# Patient Record
Sex: Female | Born: 1975 | Race: White | Hispanic: Yes | Marital: Married | Smoking: Never smoker
Health system: Southern US, Community
[De-identification: ages and names within clinical notes are randomized; demographics above are authoritative.]

## PROBLEM LIST (undated history)

## (undated) DIAGNOSIS — R1032 Left lower quadrant pain: Secondary | ICD-10-CM

## (undated) DIAGNOSIS — T8859XA Other complications of anesthesia, initial encounter: Secondary | ICD-10-CM

## (undated) DIAGNOSIS — R011 Cardiac murmur, unspecified: Secondary | ICD-10-CM

## (undated) DIAGNOSIS — I10 Essential (primary) hypertension: Secondary | ICD-10-CM

## (undated) DIAGNOSIS — U071 COVID-19: Secondary | ICD-10-CM

## (undated) DIAGNOSIS — E785 Hyperlipidemia, unspecified: Secondary | ICD-10-CM

## (undated) HISTORY — DX: Essential (primary) hypertension: I10

## (undated) HISTORY — DX: Hyperlipidemia, unspecified: E78.5

## (undated) HISTORY — PX: ABDOMINOPLASTY: SUR9

---

## 2016-09-05 ENCOUNTER — Ambulatory Visit (INDEPENDENT_AMBULATORY_CARE_PROVIDER_SITE_OTHER): Payer: Self-pay | Admitting: Physician Assistant

## 2016-09-18 ENCOUNTER — Encounter (INDEPENDENT_AMBULATORY_CARE_PROVIDER_SITE_OTHER): Payer: Self-pay | Admitting: Physician Assistant

## 2016-09-18 ENCOUNTER — Ambulatory Visit (HOSPITAL_COMMUNITY)
Admission: RE | Admit: 2016-09-18 | Discharge: 2016-09-18 | Disposition: A | Discharge status: Home / Self Care | Payer: Medicaid Other | Source: Ambulatory Visit | Attending: Physician Assistant | Admitting: Physician Assistant

## 2016-09-18 ENCOUNTER — Other Ambulatory Visit (HOSPITAL_COMMUNITY)
Admission: RE | Admit: 2016-09-18 | Discharge: 2016-09-18 | Disposition: A | Discharge status: Home / Self Care | Payer: Medicaid Other | Source: Ambulatory Visit | Attending: Family Medicine | Admitting: Family Medicine

## 2016-09-18 ENCOUNTER — Ambulatory Visit (INDEPENDENT_AMBULATORY_CARE_PROVIDER_SITE_OTHER): Payer: Medicaid Other | Admitting: Physician Assistant

## 2016-09-18 VITALS — BP 146/96 | HR 78 | Temp 98.1°F | Ht 60.5 in | Wt 175.2 lb

## 2016-09-18 DIAGNOSIS — G8929 Other chronic pain: Secondary | ICD-10-CM

## 2016-09-18 DIAGNOSIS — M2578 Osteophyte, vertebrae: Secondary | ICD-10-CM | POA: Insufficient documentation

## 2016-09-18 DIAGNOSIS — Z01411 Encounter for gynecological examination (general) (routine) with abnormal findings: Secondary | ICD-10-CM | POA: Diagnosis not present

## 2016-09-18 DIAGNOSIS — M545 Low back pain: Secondary | ICD-10-CM

## 2016-09-18 DIAGNOSIS — Z0000 Encounter for general adult medical examination without abnormal findings: Secondary | ICD-10-CM | POA: Insufficient documentation

## 2016-09-18 DIAGNOSIS — I10 Essential (primary) hypertension: Secondary | ICD-10-CM

## 2016-09-18 DIAGNOSIS — Z124 Encounter for screening for malignant neoplasm of cervix: Secondary | ICD-10-CM | POA: Diagnosis not present

## 2016-09-18 MED ORDER — LOSARTAN POTASSIUM 25 MG PO TABS: 25.0000 mg | ORAL_TABLET | Freq: Every day | ORAL | 1 refills | Status: DC

## 2016-09-18 MED ORDER — HYDROCHLOROTHIAZIDE 12.5 MG PO TABS: 12.5000 mg | ORAL_TABLET | Freq: Every day | ORAL | 1 refills | Status: DC

## 2016-09-18 NOTE — Patient Instructions (Signed)
Plan de alimentacin DASH (DASH Eating Plan) DASH es la sigla en ingls de "Enfoques Alimentarios para Detener la Hipertensin". El plan de alimentacin DASH ha demostrado bajar la presin arterial elevada (hipertensin). Los beneficios adicionales para la salud pueden incluir la disminucin del riesgo de diabetes mellitus tipo2, enfermedades cardacas e ictus. Este plan tambin puede ayudar a adelgazar. QU DEBO SABER ACERCA DEL PLAN DE ALIMENTACIN DASH? Para el plan de alimentacin DASH, seguir las siguientes pautas generales:  Elija los alimentos que contienen menos de 150 miligramos de sodio por porcin (segn se indica en la etiqueta de los alimentos).  Use hierbas o aderezos sin sal, en lugar de sal de mesa o sal marina.  Consulte al mdico o farmacutico antes de usar sustitutos de la sal.  Consuma los productos con menor contenido de sodio. Estos productos suelen estar etiquetados como "bajo en sodio" o "sin agregado de sal".  Coma alimentos frescos. No consuma una gran cantidad de alimentos enlatados.  Coma ms verduras, frutas y productos lcteos con bajo contenido de grasas.  Elija los cereales integrales. Busque la palabra "integral" en el primer lugar de la lista de ingredientes.  Elija el pescado y el pollo o el pavo sin piel ms a menudo que las carnes rojas. Limite el consumo de pescado, carne de ave y carne a 6onzas (170g) por da.  Limite el consumo de dulces, postres, azcares y bebidas azucaradas.  Elija las grasas saludables para el corazn.  Consuma ms comida casera y menos de restaurante, de buf y comida rpida.  Limite el consumo de alimentos fritos.  No fra los alimentos. A la hora de cocinarlos, opte por hornearlos, hervirlos, grillarlos y asarlos a la parrilla.  Cuando coma en un restaurante, pida que preparen su comida con menos sal o, en lo posible, sin nada de sal. QU ALIMENTOS PUEDO COMER? Pida ayuda a un nutricionista para conocer las  necesidades calricas individuales. Cereales Pan de salvado o integral. Arroz integral. Pastas de salvado o integrales. Quinua, trigo burgol y cereales integrales. Cereales con bajo contenido de sodio. Tortillas de harina de maz o de salvado. Pan de maz integral. Galletas saladas integrales. Galletas con bajo contenido de sodio. Vegetales Verduras frescas o congeladas (crudas, al vapor, asadas o grilladas). Jugos de tomate y verduras con contenido bajo o reducido de sodio. Pasta y salsa de tomate con contenido bajo o reducido de sodio. Verduras enlatadas con bajo contenido de sodio o reducido de sodio. Frutas Frutas frescas, en conserva (en su jugo natural) o frutas congeladas. Carnes y otros productos con protenas Carne de res molida (al 85% o ms magra), carne de res de animales alimentados con pastos o carne de res sin la grasa. Pollo o pavo sin piel. Carne de pollo o de pavo molida. Cerdo sin la grasa. Todos los pescados y frutos de mar. Huevos. Porotos, guisantes o lentejas secos. Frutos secos y semillas sin sal. Frijoles enlatados sin sal. Lcteos Productos lcteos con bajo contenido de grasas, como leche descremada o al 1%, quesos reducidos en grasas o al 2%, ricota con bajo contenido de grasas o queso cottage, o yogur natural con bajo contenido de grasas. Quesos con contenido bajo o reducido de sodio. Grasas y aceites Margarinas en barra que no contengan grasas trans. Mayonesa y alios para ensaladas livianos o reducidos en grasas (reducidos en sodio). [place]. Aceites de crtamo, oliva o canola. Mantequilla natural de man o almendra. Otros Palomitas de maz y pretzels sin sal. Los artculos mencionados arriba pueden no   ser una lista completa de las bebidas o los alimentos recomendados. Comunquese con el nutricionista para conocer ms opciones. QU ALIMENTOS NO SE RECOMIENDAN? Cereales Pan blanco. Pastas blancas. Arroz blanco. Pan de maz refinado. Bagels y croissants. Galletas  saladas que contengan grasas trans. Vegetales Vegetales con crema o fritos. Verduras en salsa de queso. Verduras enlatadas comunes. Pasta y salsa de tomate en lata comunes. Jugos comunes de tomate y de verduras. Frutas Fruta enlatada en almbar liviano o espeso. Jugo de frutas. Carnes y otros productos con protenas Cortes de carne con grasa. Costillas, alas de pollo, tocineta, salchicha, mortadela, salame, chinchulines, tocino, perros calientes, salchichas alemanas y embutidos envasados. Frutos secos y semillas con sal. Frijoles con sal en lata. Lcteos Leche entera o al 2%, crema, mezcla de leche y crema, y queso crema. Yogur entero o endulzado. Quesos o queso azul con alto contenido de grasas. Cremas no lcteas y coberturas batidas. Quesos procesados, quesos para untar o cuajadas. Condimentos Sal de cebolla y ajo, sal condimentada, sal de mesa y sal marina. Salsas en lata y envasadas. Salsa Worcestershire. Salsa trtara. Salsa barbacoa. Salsa teriyaki. Salsa de soja, incluso la que tiene contenido reducido de sodio. Salsa de carne. Salsa de pescado. Salsa de ostras. Salsa rosada. Rbano picante. Ketchup y mostaza. Saborizantes y tiernizantes para carne. Caldo en cubitos. Salsa picante. Salsa tabasco. Adobos. Aderezos para tacos. Salsas. Grasas y aceites Mantequilla, margarina en barra, manteca de cerdo, grasa, mantequilla clarificada y grasa de tocino. Aceites de coco, de palmiste o de palma. Aderezos comunes para ensalada. Otros Pickles y aceitunas. Palomitas de maz y pretzels con sal. Los artculos mencionados arriba pueden no ser una lista completa de las bebidas y los alimentos que se deben evitar. Comunquese con el nutricionista para obtener ms informacin. DNDE PUEDO ENCONTRAR MS INFORMACIN? [employer] [facility]mn [employer] la [facility] (National Heart, Lung, and Blood Institute): www.nhlbi.nih.gov/health/health-topics/topics/dash/ Esta informacin no tiene como fin  reemplazar el consejo del mdico. Asegrese de hacerle al mdico cualquier pregunta que tenga. Document Released: 04/27/2011 Document Revised: 08/30/2015 Document Reviewed: 03/12/2013 Elsevier Interactive Patient Education  2017 Elsevier Inc.  

## 2016-09-18 NOTE — Progress Notes (Signed)
  Subjective:  Patient ID: Tiffany Ali, female    DOB: [birth year]  Age: 41 y.o. MRN: [id number]  CC:  Physical exam  HPI Tiffany Ali Grumbling is a 41 y.o. female with a PMH of HTN and LBP presents for an annual physical and PAP. Last PAP two years ago, no hx of abnormal PAP. No current GU sxs aside from mild and occasional dyspareunia, "feels as if something is bumped inside". In regards to HTN, takes meds as prescribed. Did not take antihypertensives today because she thought meds may interfere with lab results. Continues to have LBP. Onset of LBP remote and occurred around the time of the birth of her twins. Did not have epidural. Does not endorse radiculopathy, LE weakness, paralysis, or GI/GU sxs. Denies CP, SOB, HA, abdominal pain, f/c/n/v, or rash.  ROS Review of Systems  Constitutional: Negative for chills, fever and malaise/fatigue.  Eyes: Negative for blurred vision.  Respiratory: Negative for shortness of breath.   Cardiovascular: Negative for chest pain and palpitations.  Gastrointestinal: Negative for abdominal pain and nausea.  Genitourinary: Negative for dysuria and hematuria.  Musculoskeletal: Negative for joint pain and myalgias.  Skin: Negative for rash.  Neurological: Negative for tingling and headaches.  Psychiatric/Behavioral: Negative for depression. The patient is not nervous/anxious.     Objective:  BP (!) 146/96   Pulse 78   Temp 98.1 F (36.7 C) (Oral)   Ht 5' 0.5" (1.537 m)   Wt 175 lb 3.2 oz (79.5 kg)   SpO2 100%   BMI 33.65 kg/m   BP/Weight 5/70/1779  Systolic BP 390  Diastolic BP 96  Wt. (Lbs) 175.2  BMI 33.65      Physical Exam  Constitutional: She is oriented to person, place, and time.  Well developed, overweight, NAD, polite  HENT:  Head: Normocephalic and atraumatic.  Mouth/Throat: Oropharynx is clear and moist.  Eyes: No scleral icterus.  Neck: Normal range of motion. Neck supple. No thyromegaly present.  Cardiovascular: Normal rate,  regular rhythm and normal heart sounds.   Pulmonary/Chest: Effort normal and breath sounds normal.  Abdominal: Soft. Bowel sounds are normal. There is no tenderness.  Genitourinary:  Genitourinary Comments: Vulva normal, no lesions, vagina without discharge, cervix normal with no discharge or cervical motion tenderness. No adnexal masses or tenderness. Normal uterus.  Musculoskeletal: She exhibits no edema.  Neurological: She is alert and oriented to person, place, and time. She has normal reflexes. No cranial nerve deficit. Coordination normal.  Skin: Skin is warm and dry. No rash noted. No erythema. No pallor.  Psychiatric: She has a normal mood and affect. Her behavior is normal. Thought content normal.  Vitals reviewed.    Assessment & Plan:   1. Annual physical exam - CBC with Differential - Comprehensive metabolic panel - Lipid panel - Cytology - PAP - HIV antibody  2. Papanicolaou smear for cervical cancer screening - Cytology - PAP  3. Hypertension, unspecified type - Take Losartan and HCTZ as directed.  - Pt's blood pressure log shows optimal control of BP. Advised pt to return if BP is elevated despite use of her current treatment regimen.  No orders of the defined types were placed in this encounter.   Follow-up: Return in about 6 months (around 03/20/2017) for HTN f/u.   Clent Demark PA

## 2016-09-19 ENCOUNTER — Other Ambulatory Visit (INDEPENDENT_AMBULATORY_CARE_PROVIDER_SITE_OTHER): Payer: Self-pay | Admitting: Physician Assistant

## 2016-09-19 DIAGNOSIS — E785 Hyperlipidemia, unspecified: Secondary | ICD-10-CM

## 2016-09-19 LAB — CBC WITH DIFFERENTIAL/PLATELET
BASOS: 0 %
Basophils Absolute: 0 10*3/uL (ref 0.0–0.2)
EOS (ABSOLUTE): 0.1 10*3/uL (ref 0.0–0.4)
Eos: 1 %
Hematocrit: 37.9 % (ref 34.0–46.6)
Hemoglobin: 12.8 g/dL (ref 11.1–15.9)
IMMATURE GRANULOCYTES: 0 %
Immature Grans (Abs): 0 10*3/uL (ref 0.0–0.1)
Lymphocytes Absolute: 3.3 10*3/uL — ABNORMAL HIGH (ref 0.7–3.1)
Lymphs: 34 %
MCH: 28.7 pg (ref 26.6–33.0)
MCHC: 33.8 g/dL (ref 31.5–35.7)
MCV: 85 fL (ref 79–97)
MONOS ABS: 0.5 10*3/uL (ref 0.1–0.9)
Monocytes: 6 %
Neutrophils Absolute: 5.6 10*3/uL (ref 1.4–7.0)
Neutrophils: 59 %
PLATELETS: 329 10*3/uL (ref 150–379)
RBC: 4.46 x10E6/uL (ref 3.77–5.28)
RDW: 13.4 % (ref 12.3–15.4)
WBC: 9.6 10*3/uL (ref 3.4–10.8)

## 2016-09-19 LAB — LIPID PANEL
CHOLESTEROL TOTAL: 191 mg/dL (ref 100–199)
Chol/HDL Ratio: 4.1 ratio (ref 0.0–4.4)
HDL: 47 mg/dL (ref >39)
LDL Calculated: 123 mg/dL — ABNORMAL HIGH (ref 0–99)
TRIGLYCERIDES: 107 mg/dL (ref 0–149)
VLDL Cholesterol Cal: 21 mg/dL (ref 5–40)

## 2016-09-19 LAB — COMPREHENSIVE METABOLIC PANEL
ALT: 12 IU/L (ref 0–32)
AST: 15 IU/L (ref 0–40)
Albumin/Globulin Ratio: 1.4 (ref 1.2–2.2)
Albumin: 4.5 g/dL (ref 3.5–5.5)
Alkaline Phosphatase: 76 IU/L (ref 39–117)
BILIRUBIN TOTAL: 0.3 mg/dL (ref 0.0–1.2)
BUN/Creatinine Ratio: 26 — ABNORMAL HIGH (ref 9–23)
BUN: 16 mg/dL (ref 6–20)
CALCIUM: 9.4 mg/dL (ref 8.7–10.2)
CHLORIDE: 96 mmol/L (ref 96–106)
CO2: 28 mmol/L (ref 18–29)
Creatinine, Ser: 0.62 mg/dL (ref 0.57–1.00)
GFR calc Af Amer: 132 mL/min/{1.73_m2} (ref >59)
GFR, EST NON AFRICAN AMERICAN: 115 mL/min/{1.73_m2} (ref >59)
Globulin, Total: 3.3 g/dL (ref 1.5–4.5)
Glucose: 105 mg/dL — ABNORMAL HIGH (ref 65–99)
POTASSIUM: 4 mmol/L (ref 3.5–5.2)
Sodium: 140 mmol/L (ref 134–144)
Total Protein: 7.8 g/dL (ref 6.0–8.5)

## 2016-09-19 LAB — HIV ANTIBODY (ROUTINE TESTING W REFLEX): HIV Screen 4th Generation wRfx: NONREACTIVE

## 2016-09-19 MED ORDER — SIMVASTATIN 20 MG PO TABS: 20.0000 mg | ORAL_TABLET | Freq: Every day | ORAL | 1 refills | Status: DC

## 2016-09-19 NOTE — Progress Notes (Signed)
LDL elevated.

## 2016-09-20 LAB — CYTOLOGY - PAP: Diagnosis: NEGATIVE

## 2016-11-01 ENCOUNTER — Encounter (INDEPENDENT_AMBULATORY_CARE_PROVIDER_SITE_OTHER): Payer: Self-pay | Admitting: Physician Assistant

## 2016-11-01 ENCOUNTER — Ambulatory Visit (INDEPENDENT_AMBULATORY_CARE_PROVIDER_SITE_OTHER): Payer: Medicaid Other | Admitting: Physician Assistant

## 2016-11-01 VITALS — BP 152/89 | HR 91 | Temp 98.7°F

## 2016-11-01 DIAGNOSIS — I10 Essential (primary) hypertension: Secondary | ICD-10-CM | POA: Diagnosis not present

## 2016-11-01 MED ORDER — LOSARTAN POTASSIUM 100 MG PO TABS: 100.0000 mg | ORAL_TABLET | Freq: Every day | ORAL | 2 refills | Status: DC

## 2016-11-01 MED ORDER — AMLODIPINE BESYLATE 5 MG PO TABS: 5.0000 mg | ORAL_TABLET | Freq: Every day | ORAL | 3 refills | Status: DC

## 2016-11-01 MED ORDER — CARVEDILOL 12.5 MG PO TABS: 12.5000 mg | ORAL_TABLET | Freq: Two times a day (BID) | ORAL | 1 refills | Status: DC

## 2016-11-01 NOTE — Patient Instructions (Signed)

## 2016-11-01 NOTE — Progress Notes (Signed)
Subjective:  Patient ID: Tiffany Ali, female    DOB: [birth year]  Age: 41 y.o. MRN: [id number]  CC: BP check  HPI Tiffany Ali is a 41 y.o. female with a PMH of HTN presents to f/u on BP. Says her BP is usually in the 381W systolic and pulse is usually in the 90s despite taking her HCTZ 12.5mg  and Losartan 25mg . She also complains of HCTZ making her too fatigued and "out of it". States that she is trying to watch sodium intake. Is walking "a little" for exercise. Only associated symptom is palpitations once or twice per month when she increases activities such as when walking. Does not endorse any other symptoms.    Outpatient Medications Prior to Visit  Medication Sig Dispense Refill  . simvastatin (ZOCOR) 20 MG tablet Take 1 tablet (20 mg total) by mouth at bedtime. 90 tablet 1  . hydrochlorothiazide (HYDRODIURIL) 12.5 MG tablet Take 1 tablet (12.5 mg total) by mouth daily. 90 tablet 1  . losartan (COZAAR) 25 MG tablet Take 1 tablet (25 mg total) by mouth daily. 90 tablet 1   No facility-administered medications prior to visit.      ROS Review of Systems  Constitutional: Negative for chills, fever and malaise/fatigue.  Eyes: Negative for blurred vision.  Respiratory: Negative for shortness of breath.   Cardiovascular: Negative for chest pain and palpitations.  Gastrointestinal: Negative for abdominal pain and nausea.  Genitourinary: Negative for dysuria and hematuria.  Musculoskeletal: Negative for joint pain and myalgias.  Skin: Negative for rash.  Neurological: Negative for tingling and headaches.  Psychiatric/Behavioral: Negative for depression. The patient is not nervous/anxious.     Objective:  BP (!) 152/89   Pulse 91   Temp 98.7 F (37.1 C) (Oral)   SpO2 99%   BP/Weight 11/01/2016 2/99/3716  Systolic BP 967 893  Diastolic BP 89 96  Wt. (Lbs) - 175.2  BMI - 33.65      Physical Exam  Constitutional: She is oriented to person, place, and time.  Well  developed, overweight, NAD, polite  HENT:  Head: Normocephalic and atraumatic.  Eyes: No scleral icterus.  Neck: Normal range of motion. Neck supple. No thyromegaly present.  Cardiovascular: Normal rate, regular rhythm, normal heart sounds and intact distal pulses.   Pulmonary/Chest: Effort normal and breath sounds normal.  Musculoskeletal: She exhibits no edema.  Neurological: She is alert and oriented to person, place, and time. No cranial nerve deficit. Coordination normal.  Skin: Skin is warm and dry. No rash noted. No erythema. No pallor.  Psychiatric: She has a normal mood and affect. Her behavior is normal. Thought content normal.  Vitals reviewed.    Assessment & Plan:   1. Essential hypertension - Increase losartan (COZAAR) 100 MG tablet; Take 1 tablet (100 mg total) by mouth daily.  Dispense: 30 tablet; Refill: 2 - Begin carvedilol (COREG) 12.5 MG tablet; Take 1 tablet (12.5 mg total) by mouth 2 (two) times daily with a meal.  Dispense: 90 tablet; Refill: 1   Meds ordered this encounter  Medications  . losartan (COZAAR) 100 MG tablet    Sig: Take 1 tablet (100 mg total) by mouth daily.    Dispense:  30 tablet    Refill:  2    Order Specific Question:   Supervising Provider    Answer:   Tresa Garter [id number]  . DISCONTD: amLODipine (NORVASC) 5 MG tablet    Sig: Take 1 tablet (5 mg total) by mouth daily.  Dispense:  90 tablet    Refill:  3    Order Specific Question:   Supervising Provider    Answer:   Tresa Garter [id number]  . carvedilol (COREG) 12.5 MG tablet    Sig: Take 1 tablet (12.5 mg total) by mouth 2 (two) times daily with a meal.    Dispense:  90 tablet    Refill:  1    Order Specific Question:   Supervising Provider    Answer:   Tresa Garter [id number]    Follow-up: Return in about 4 weeks (around 11/29/2016) for Hypertension.   Clent Demark PA

## 2016-11-07 ENCOUNTER — Encounter (INDEPENDENT_AMBULATORY_CARE_PROVIDER_SITE_OTHER): Payer: Medicaid Other

## 2016-12-01 ENCOUNTER — Ambulatory Visit (INDEPENDENT_AMBULATORY_CARE_PROVIDER_SITE_OTHER): Payer: Medicaid Other | Admitting: Physician Assistant

## 2016-12-01 ENCOUNTER — Encounter (INDEPENDENT_AMBULATORY_CARE_PROVIDER_SITE_OTHER): Payer: Self-pay | Admitting: Physician Assistant

## 2016-12-01 VITALS — BP 171/102 | HR 104 | Temp 98.2°F | Wt 174.0 lb

## 2016-12-01 DIAGNOSIS — R011 Cardiac murmur, unspecified: Secondary | ICD-10-CM

## 2016-12-01 DIAGNOSIS — I10 Essential (primary) hypertension: Secondary | ICD-10-CM

## 2016-12-01 DIAGNOSIS — E785 Hyperlipidemia, unspecified: Secondary | ICD-10-CM | POA: Diagnosis not present

## 2016-12-01 MED ORDER — SIMVASTATIN 20 MG PO TABS: 20.0000 mg | ORAL_TABLET | Freq: Every day | ORAL | 1 refills | Status: DC

## 2016-12-01 MED ORDER — LOSARTAN POTASSIUM 100 MG PO TABS: 100.0000 mg | ORAL_TABLET | Freq: Every day | ORAL | 1 refills | Status: DC

## 2016-12-01 NOTE — Progress Notes (Signed)
Subjective:  Patient ID: Tiffany Ali, female    DOB: 1975-08-24  Age: 41 y.o. MRN: [id number]  CC: f/u HTN  HPI [name] Grumbling is a 41 y.o. female with a PMH of HTN presents to f/u on BP. Office BP readings have been 146/96 and 152/89. Today's BP is 171/102 repeat 159/93. Says BP is usually higher when seeing a provider. Reports BP is usually 659D systolic. Patient states she is taking Losartan 100 mg. Took carvedilol for only two days because she fell "out of it".  States that she is trying to watch sodium intake. Is walking "a little" for exercise. Only associated symptom is palpitations once or twice per month when she increases activities such as when walking. Does not endorse any other symptoms.     ROS Review of Systems  Constitutional: Negative for chills, fever and malaise/fatigue.  Eyes: Negative for blurred vision.  Respiratory: Negative for shortness of breath.   Cardiovascular: Positive for palpitations. Negative for chest pain.  Gastrointestinal: Negative for abdominal pain and nausea.  Genitourinary: Negative for dysuria and hematuria.  Musculoskeletal: Negative for joint pain and myalgias.  Skin: Negative for rash.  Neurological: Negative for tingling and headaches.  Psychiatric/Behavioral: Negative for depression. The patient is not nervous/anxious.     Objective:  BP (!) 171/102 (BP Location: Left Arm, Patient Position: Sitting, Cuff Size: Large)   Pulse (!) 104   Temp 98.2 F (36.8 C) (Oral)   Wt 174 lb (78.9 kg)   LMP 11/22/2016 (Exact Date)   SpO2 100%   BMI 33.42 kg/m   BP/Weight 12/01/2016 11/01/2016 3/57/0177  Systolic BP 939 030 092  Diastolic BP 330 89 96  Wt. (Lbs) 174 - 175.2  BMI 33.42 - 33.65      Physical Exam  Constitutional: She is oriented to person, place, and time.  Well developed, overweight, NAD, polite  HENT:  Head: Normocephalic and atraumatic.  Eyes: No scleral icterus.  Neck: No thyromegaly present.  Cardiovascular: Normal  rate, regular rhythm and normal heart sounds.   2/6 pansystolic murmur. S1 and S2. No rubs. No carotid bruits.  Pulmonary/Chest: Effort normal and breath sounds normal.  Musculoskeletal: She exhibits no edema.  Neurological: She is alert and oriented to person, place, and time. No cranial nerve deficit. Coordination normal.  Skin: Skin is warm and dry. No rash noted. No erythema. No pallor.  Psychiatric: She has a normal mood and affect. Her behavior is normal. Thought content normal.  Vitals reviewed.    Assessment & Plan:   1. Hypertension, unspecified type - Ambulatory referral to Cardiology - Refill losartan (COZAAR) 100 MG tablet; Take 1 tablet (100 mg total) by mouth daily.  Dispense: 90 tablet; Refill: 1  2. Hyperlipidemia, unspecified hyperlipidemia type - Refill simvastatin (ZOCOR) 20 MG tablet; Take 1 tablet (20 mg total) by mouth at bedtime.  Dispense: 90 tablet; Refill: 1  3. Heart murmur - Ambulatory referral to Cardiology   Meds ordered this encounter  Medications  . simvastatin (ZOCOR) 20 MG tablet    Sig: Take 1 tablet (20 mg total) by mouth at bedtime.    Dispense:  90 tablet    Refill:  1    Order Specific Question:   Supervising Provider    Answer:   Tresa Garter [id number]  . losartan (COZAAR) 100 MG tablet    Sig: Take 1 tablet (100 mg total) by mouth daily.    Dispense:  90 tablet    Refill:  1  Order Specific Question:   Supervising Provider    Answer:   Tresa Garter [[id number]]    Follow-up: Return in about 4 months (around 04/03/2017).   Clent Demark PA

## 2016-12-01 NOTE — Patient Instructions (Addendum)
Hipertensin Hypertension La hipertensin, conocida comnmente como presin arterial alta, se produce cuando la sangre bombea en las arterias con mucha fuerza. Las arterias son los vasos sanguneos que transportan la sangre desde el corazn al resto del cuerpo. La hipertensin hace que el corazn haga ms esfuerzo para Chiropodist y [employer] Corporation que las arterias se Teacher, music o Advertising account executive. La hipertensin no tratada o no controlada puede causar infarto de miocardio, accidentes cerebrovasculares, enfermedad renal y otros problemas. Una lectura de la presin arterial consiste de un nmero ms alto sobre un nmero ms bajo. En condiciones ideales, la presin arterial debe estar por debajo de 120/80. El primer nmero ("superior") es la presin sistlica. Es la medida de la presin de las arterias cuando el corazn late. El segundo nmero ("inferior") es la presin diastlica. Es la medida de la presin en las arterias cuando el corazn se relaja. Cules son las causas? Se desconoce la causa de esta afeccin. Qu incrementa el riesgo? Algunos factores de riesgo de hipertensin estn bajo su control. Otros no. Factores que puede [employer].  Tener diabetes mellitus tipo 2, colesterol alto, o ambos.  No hacer la cantidad suficiente de actividad fsica o ejercicio.  Tener sobrepeso.  Consumir mucha grasa, azcar, caloras o sal (sodio) en su dieta.  Beber alcohol en exceso. Factores que son difciles o imposibles de modificar  Tener enfermedad renal crnica.  Tener antecedentes familiares de presin arterial alta.  La edad. Los riesgos aumentan con la edad.  La raza. El riesgo es mayor para las Retail banker.  El sexo. Antes de los 45aos, los hombres corren ms [employer]. Despus de los 65aos, las mujeres corren ms [employer].  Tener apnea obstructiva del sueo.  El estrs. Cules son los signos o los sntomas? La presin arterial  extremadamente alta (crisis hipertensiva) puede provocar:  Dolor de [place].  Ansiedad.  Falta de aire.  Hemorragia nasal.  Nuseas y vmitos.  Dolor de pecho intenso.  Una crisis de movimientos que no puede controlar (convulsiones).  Cmo se diagnostica? Esta afeccin se diagnostica midiendo su presin arterial mientras se encuentra sentado, con el brazo apoyado sobre una superficie. El brazalete del tensimetro debe colocarse directamente sobre la piel de la parte superior del brazo y al nivel de su corazn. Debe medirla al [facility] veces en el mismo brazo. Determinadas condiciones pueden causar una diferencia de presin arterial entre el brazo izquierdo y Insurance underwriter. Ciertos factores pueden provocar que las lecturas de la presin arterial sean inferiores o superiores a lo normal (elevadas) por un perodo corto de tiempo:  Si su presin arterial es ms alta cuando se encuentra en el consultorio del mdico que cuando la mide en su hogar, se denomina "hipertensin de bata blanca". La [employer] de las personas que tienen esta afeccin no deben ser [employer].  Si su presin arterial es ms alta en el hogar que cuando se encuentra en el consultorio del mdico, se denomina "hipertensin enmascarada". La [employer] de las personas que tienen esta afeccin deben ser medicadas para Chief Technology Officer la presin arterial.  Si tiene una lecturas de presin arterial alta durante una visita o si tiene presin arterial normal con otros factores de riesgo:  Es posible que se le pida que regrese Administrator, arts para volver a Chief Technology Officer su presin arterial.  Se le puede pedir que se controle la presin arterial en su casa durante 1 semana o ms.  Si se le diagnostica hipertensin, es posible que  se le realicen otros anlisis de sangre o estudios de diagnstico por imgenes para ayudar a su mdico a comprender su riesgo general de tener otras afecciones. Cmo se trata? Esta afeccin se trata haciendo cambios saludables  en el estilo de vida, tales como ingerir alimentos saludables, realizar ms ejercicio y reducir el consumo de alcohol. El mdico puede recetarle medicamentos si los cambios en el estilo de vida no son suficientes para Child psychotherapist la presin arterial y si:  Su presin arterial sistlica est por encima de 130.  Su presin arterial diastlica est por encima de 80.  La presin arterial deseada puede variar en funcin de las enfermedades, la edad y otros factores personales. Siga estas instrucciones en su casa: Comida y bebida  Siga una dieta con alto contenido de fibras y [place], y con bajo contenido de sodio, Location manager agregada y Physicist, medical. Un ejemplo de plan alimenticio es la dieta DASH (Dietary Approaches to Stop Hypertension, Mtodos alimenticios para detener la hipertensin). Para alimentarse de esta manera: ? Coma mucha fruta y [place]. Trate de que la mitad del plato de cada comida sea de frutas y verduras. ? Coma cereales integrales, como pasta integral, arroz integral y pan integral. Llene aproximadamente un cuarto del plato con cereales integrales. ? Coma y beba productos lcteos con bajo contenido de grasa, como leche descremada o yogur bajo en grasas. ? Evite la ingesta de cortes de carne grasa, carne procesada o curada, y carne de ave con piel. Llene aproximadamente un cuarto del plato con protenas magras, como pescado, pollo sin piel, frijoles, huevos y tofu. ? Evite ingerir alimentos prehechos o procesados. En general, estos tienen mayor cantidad de sodio, azcar agregada y Wendee Copp.  Reduzca su ingesta diaria de sodio. La mayora de las personas que tienen hipertensin deben comer menos de 1500 mg de sodio por [employer].  Limite el consumo de alcohol a no ms de 1 medida por da si es mujer y no est Music therapist y a 2 medidas por da si es hombre. Una medida equivale a 12onzas de cerveza, 5onzas de vino o 1onzas de bebidas alcohlicas de alta graduacin. Estilo de vida  Trabaje  con su mdico para mantener un peso saludable o Administrator, Civil Service. Pregntele cual es su peso recomendado.  Realice al menos 30 minutos de ejercicio que haga que se acelere su corazn (ejercicio Arboriculturist) la [employer] de la [place]. Estas actividades pueden incluir caminar, nadar o andar en bicicleta.  Incluya ejercicios para fortalecer sus msculos (ejercicios de resistencia), como pilates o levantamiento de pesas, como parte de su rutina semanal de ejercicios. Intente realizar 14mnutos de este tipo de ejercicios al mSolectron Corporationa la [place]  No consuma ningn producto que contenga nicotina o tabaco, como cigarrillos y cPsychologist, sport and exercise Si necesita ayuda para dejar de fumar, consulte al mdico.  Contrlese la presin arterial en su casa segn las indicaciones del mdico.  Concurra a todas las visitas de control como se lo haya indicado el mdico. Esto es importante. Medicamentos  TDelphide venta libre y los recetados solamente como se lo haya indicado el mdico. Siga cuidadosamente las indicaciones. Los medicamentos para la presin arterial deben tomarse segn las indicaciones.  No omita las dosis de medicamentos para la presin arterial. Si lo hace, estar en riesgo de tener problemas y puede hacer que los medicamentos sean menos eficaces.  Pregntele a su mdico a qu efectos secundarios o reacciones a los mCareers information officer Comunquese con  un mdico si:  Piensa que tiene una reaccin a un medicamento que est tomando.  Tiene dolores de cabeza frecuentes (recurrentes).  Siente mareos.  Tiene hinchazn en los tobillos.  Tiene problemas de visin. Solicite ayuda de inmediato si:  Siente un dolor de cabeza intenso o confusin.  Siente debilidad inusual o adormecimiento.  Siente que va a desmayarse.  Siente un dolor intenso en el pecho o el abdomen.  Vomita repetidas veces.  Tiene dificultad para respirar. Resumen  La  hipertensin se produce cuando la sangre bombea en las arterias con mucha fuerza. Si esta afeccin no se controla, podra correr riesgo de tener complicaciones graves.  La presin arterial deseada puede variar en funcin de las enfermedades, la edad y otros factores personales. Para la [employer], una presin arterial normal es menor que 120/80.  La hipertensin se trata con cambios en el estilo de vida, medicamentos o una combinacin de [place]. Los McDonald's Corporation estilo de vida incluyen prdida de peso, ingerir alimentos sanos, seguir una dieta baja en sodio, hacer ms ejercicio y Environmental consultant consumo de alcohol. Esta informacin no tiene Marine scientist el consejo del mdico. Asegrese de hacerle al mdico cualquier pregunta que tenga. Document Released: 05/08/2005 Document Revised: 04/19/2016 Document Reviewed: 04/19/2016 Elsevier Interactive Patient Education  2018 [place] (Heart Murmur) Un soplo cardaco es un sonido adicional que su mdico oye cuando escucha su corazn con un dispositivo denominado estetoscopio. El sonido proviene de una turbulencia provocada cuando la sangre circula a travs del corazn y el sonido puede ser como un zumbido o un silbido cuando el corazn late. Existen dos tipos de soplos cardacos:  Soplo cardaco inocente. La mayora de las personas con este tipo de soplo no tienen un problema cardaco. Muchos nios tienen soplos cardacos inocentes. Su mdico puede sugerir Tesoro Corporation bsicos para saber si el soplo es un soplo inocente. Si se descubre un soplo cardaco inocente, no hay necesidad de PPL Corporation, Building services engineer, restringir las actividades ni suspender la prctica de deportes.  Soplo cardaco anormal. Este tipo de soplo cardaco puede aparecer en nios y adultos. En los nios, por lo general los soplos cardacos anormales son causados por defectos cardacos que estn presentes desde el nacimiento  (congnitos). En los adultos, los soplos anormales normalmente provienen de problemas en las vlvulas cardacas causados por una enfermedad, infeccin o el envejecimiento. CAUSAS Normalmente, estas vlvulas se abren para dejar que la sangre circule a travs o fuera del corazn y Air traffic controller se cierran para evitar que la sangre regrese. Si las vlvulas no funcionan correctamente, puede experimentar los siguientes sntomas:  Regurgitacin: cuando la sangre se filtra a travs de la vlvula en la direccin incorrecta.  Prolapso de la vlvula mitral: cuando la vlvula mitral del corazn tiene una aleta suelta y no se cierra hermticamente.  Estenosis: cuando la vlvula no se abre lo suficiente y bloquea el flujo sanguneo. [place] soplos inocentes no provocan sntomas, y muchas personas con soplos anormales pueden tener sntomas o no. Si hay sntomas, pueden ser:  Hollace Kinnier de aire.  Color azul en la piel, especialmente en las puntas de los dedos.  Dolor en el pecho.  Palpitaciones, sentir un aleteo o latido cardaco irregular.  Desmayos.  Tos persistente.  Cansarse con mucha ms rapidez de lo esperado. DIAGNSTICO Un soplo cardaco se puede escuchar durante un examen mdico deportivo o durante otro tipo de examen. Cuando se escucha un soplo, esto puede  sugerir un posible problema. Cuando esto ocurre, es posible que el mdico le recomiende ver a Forensic scientist (cardilogo). Tambin le pueden pedir que se realice uno o ms estudios cardacos. En [employer], los estudios pueden variar segn lo que escuch su mdico. Estos son algunos de los estudios indicados para un soplo cardaco:  Aeronautical engineer.  Ecocardiograma.  Resonancia magntica. Para nios y adultos que tienen un soplo cardaco anormal y desean Careers information officer, es importante completar los estudios, Physiological scientist los [place] con su mdico y seguir sus Scientist, research (life sciences). Si hay una enfermedad cardaca, es posible  que no sea seguro practicar un deporte. TRATAMIENTO Los soplos inocentes no requieren tratamiento ni restriccin de actividades. Si un soplo anormal representa un problema en el corazn, el tratamiento depender de la naturaleza exacta del problema. En [employer], es posible que sean necesarios medicamentos o ciruga para tratar el problema. INSTRUCCIONES PARA EL CUIDADO EN EL HOGAR Si desea practicar un deporte u otros tipos de [place] fsica intensa, es importante analizar esta situacin primero con su mdico. Si el soplo representa un problema en el corazn y decide practicar un deporte, existe una pequea probabilidad de que suceda un problema grave (incluida muerte sbita). SOLICITE ATENCIN MDICA SI:  Siente que sus sntomas empeoran lentamente.  Desarrolla sntomas nuevos que le preocupan.  Siente que los medicamentos recetados le provocan efectos secundarios.  SOLICITE ATENCIN MDICA DE INMEDIATO SI:  Siente dolor en el pecho.  Le falta el aire.  Observa que su corazn late de forma irregular, con una frecuencia preocupante  Sufre episodios de [employer].  Los sntomas empeoran repentinamente.  Esta informacin no tiene Marine scientist el consejo del mdico. Asegrese de hacerle al mdico cualquier pregunta que tenga. Document Released: 05/08/2005 Document Revised: 05/29/2014 Document Reviewed: 11/11/2015 Elsevier Interactive Patient Education  2017 [employer].

## 2017-01-09 NOTE — Progress Notes (Signed)
Cardiology Office Note   Date:  01/10/2017   ID:  Tiffany, Ali 22-Sep-1975, MRN [id number]  PCP:  Clent Demark, PA-C  Cardiologist:   Peter Martinique, MD   Chief Complaint  Patient presents with  . Follow-up    NP. BP going up and down.  Marland Kitchen Heart Murmur      History of Present Illness: Tiffany Ali is a 41 y.o. female who is seen at the request of Dr. Altamease Oiler for evaluation of a heart murmur. She has a history of HTN and HLD. Her husband is a Theme park manager from [place] living in the [place] for one year. She reports severe HTN with pregnancy. Still will have elevation of BP when under stress. Seen for physical and noted to have a heart murmur. Does not know of prior history of a heart murmur. Denies chest pain, SOB, dizziness. Does note fleeing symptoms of heart racing about once a week lasting seconds at a time. No history of rheumatic fever.  Past Medical History:  Diagnosis Date  . Hyperlipidemia   . Hypertension     Past Surgical History:  Procedure Laterality Date  . CESAREAN SECTION       Current Outpatient Prescriptions  Medication Sig Dispense Refill  . carvedilol (COREG) 12.5 MG tablet Take 1 tablet (12.5 mg total) by mouth 2 (two) times daily with a meal. 90 tablet 1  . losartan (COZAAR) 100 MG tablet Take 1 tablet (100 mg total) by mouth daily. 90 tablet 1  . simvastatin (ZOCOR) 20 MG tablet Take 1 tablet (20 mg total) by mouth at bedtime. 90 tablet 1   No current facility-administered medications for this visit.     Allergies:   Patient has no known allergies.    Social History:  The patient  reports that she has never smoked. She has never used smokeless tobacco.   Family History:  The patient's family history includes Hypertension in her mother.    ROS:  Please see the history of present illness.   Otherwise, review of systems are positive for none.   All other systems are reviewed and negative.    PHYSICAL EXAM: VS:  BP 126/80   Pulse 95   Ht 5' 0.5"  (1.537 m)   Wt 173 lb (78.5 kg)   BMI 33.23 kg/m  , BMI Body mass index is 33.23 kg/m. GEN: Well nourished, well developed, in no acute distress  HEENT: normal  Neck: no JVD, carotid bruits, or masses Cardiac: RRR; Normal S1-2. There is a 0-8/6 systolic murmur RUSB. No diastolic murmur.  Respiratory:  clear to auscultation bilaterally, normal work of breathing GI: soft, nontender, nondistended, + BS MS: no deformity or atrophy  Skin: warm and dry, no rash Neuro:  Strength and sensation are intact Psych: euthymic mood, full affect   EKG:  EKG is ordered today. The ekg ordered today demonstrates NSR with normal Ecg.    Recent Labs: 09/18/2016: ALT 12; BUN 16; Creatinine, Ser 0.62; Hemoglobin 12.8; Platelets 329; Potassium 4.0; Sodium 140    Lipid Panel    Component Value Date/Time   CHOL 191 09/18/2016 0920   TRIG 107 09/18/2016 0920   HDL 47 09/18/2016 0920   CHOLHDL 4.1 09/18/2016 0920   LDLCALC 123 (H) 09/18/2016 0920      Wt Readings from Last 3 Encounters:  01/10/17 173 lb (78.5 kg)  12/01/16 174 lb (78.9 kg)  09/18/16 175 lb 3.2 oz (79.5 kg)      Other  studies Reviewed: Additional studies/ records that were reviewed today include: none   ASSESSMENT AND PLAN:  1.  Heart murmur. I suspect this is an innocent heart murmur or possibly related to HTN. She is asymptomatic except for some mild palpitations. Ecg is normal. I have recommended an Echocardiogram to further evaluate. If normal I would just reassure.    Current medicines are reviewed at length with the patient today.  The patient does not have concerns regarding medicines.  The following changes have been made:  no change  Labs/ tests ordered today include: Echocardiogram   Disposition:   FU TBD  Signed, Peter Martinique, MD  01/10/2017 2:19 PM    [facility] [address], [place], [place], [address] Phone [phone], Fax [phone]

## 2017-01-10 ENCOUNTER — Ambulatory Visit (INDEPENDENT_AMBULATORY_CARE_PROVIDER_SITE_OTHER): Payer: Medicaid Other | Admitting: Cardiology

## 2017-01-10 ENCOUNTER — Encounter: Payer: Self-pay | Admitting: Cardiology

## 2017-01-10 VITALS — BP 126/80 | HR 95 | Ht 60.5 in | Wt 173.0 lb

## 2017-01-10 DIAGNOSIS — I10 Essential (primary) hypertension: Secondary | ICD-10-CM

## 2017-01-10 DIAGNOSIS — R011 Cardiac murmur, unspecified: Secondary | ICD-10-CM

## 2017-01-16 ENCOUNTER — Other Ambulatory Visit: Payer: Self-pay

## 2017-01-16 ENCOUNTER — Ambulatory Visit (HOSPITAL_COMMUNITY): Payer: Medicaid Other | Attending: Cardiology

## 2017-01-16 DIAGNOSIS — I061 Rheumatic aortic insufficiency: Secondary | ICD-10-CM | POA: Insufficient documentation

## 2017-01-16 DIAGNOSIS — I5030 Unspecified diastolic (congestive) heart failure: Secondary | ICD-10-CM | POA: Diagnosis not present

## 2017-01-16 DIAGNOSIS — R011 Cardiac murmur, unspecified: Secondary | ICD-10-CM

## 2017-01-23 ENCOUNTER — Telehealth (INDEPENDENT_AMBULATORY_CARE_PROVIDER_SITE_OTHER): Payer: Self-pay | Admitting: Physician Assistant

## 2017-01-23 NOTE — Telephone Encounter (Signed)
Spanish patient wants to know her results of her EKG and Echo results  Thank You

## 2017-01-26 NOTE — Telephone Encounter (Signed)
MA used pacific interpreter to contact the patient. Interpreter name: Annalee Genta ID: [id number] Patient verified DOB Patient is aware of EKG and ECHO being normal.

## 2017-05-07 ENCOUNTER — Encounter (INDEPENDENT_AMBULATORY_CARE_PROVIDER_SITE_OTHER): Payer: Self-pay | Admitting: Physician Assistant

## 2017-05-25 ENCOUNTER — Encounter (HOSPITAL_COMMUNITY): Payer: Self-pay | Admitting: Emergency Medicine

## 2017-05-25 ENCOUNTER — Other Ambulatory Visit: Payer: Self-pay

## 2017-05-25 ENCOUNTER — Emergency Department (HOSPITAL_COMMUNITY): Payer: Medicaid Other

## 2017-05-25 DIAGNOSIS — Y9367 Activity, basketball: Secondary | ICD-10-CM | POA: Insufficient documentation

## 2017-05-25 DIAGNOSIS — Y929 Unspecified place or not applicable: Secondary | ICD-10-CM | POA: Diagnosis not present

## 2017-05-25 DIAGNOSIS — Y999 Unspecified external cause status: Secondary | ICD-10-CM | POA: Diagnosis not present

## 2017-05-25 DIAGNOSIS — X58XXXA Exposure to other specified factors, initial encounter: Secondary | ICD-10-CM | POA: Insufficient documentation

## 2017-05-25 DIAGNOSIS — Z79899 Other long term (current) drug therapy: Secondary | ICD-10-CM | POA: Diagnosis not present

## 2017-05-25 DIAGNOSIS — I10 Essential (primary) hypertension: Secondary | ICD-10-CM | POA: Insufficient documentation

## 2017-05-25 DIAGNOSIS — S6991XA Unspecified injury of right wrist, hand and finger(s), initial encounter: Secondary | ICD-10-CM | POA: Diagnosis not present

## 2017-05-25 NOTE — ED Triage Notes (Signed)
Pt jammed her pinkie on her right hand while playing basketball.  Area appears swollen.

## 2017-05-26 ENCOUNTER — Emergency Department (HOSPITAL_COMMUNITY)
Admission: EM | Admit: 2017-05-26 | Discharge: 2017-05-26 | Disposition: A | Discharge status: Home / Self Care | Payer: Medicaid Other | Attending: Emergency Medicine | Admitting: Emergency Medicine

## 2017-05-26 DIAGNOSIS — S6991XA Unspecified injury of right wrist, hand and finger(s), initial encounter: Secondary | ICD-10-CM

## 2017-05-26 MED ORDER — NAPROXEN 250 MG PO TABS
500.0000 mg | ORAL_TABLET | Freq: Once | ORAL | Status: AC
Start: 2017-05-26 — End: 2017-05-26
  Administered 2017-05-26: 500 mg via ORAL
  Filled 2017-05-26: qty 2

## 2017-05-26 MED ORDER — NAPROXEN 500 MG PO TABS: 500.0000 mg | ORAL_TABLET | Freq: Two times a day (BID) | ORAL | 0 refills | Status: DC

## 2017-05-26 NOTE — ED Provider Notes (Signed)
[facility] EMERGENCY DEPARTMENT Provider Note   CSN: [id number] Arrival date & time: 05/25/17  2145     History   Chief Complaint No chief complaint on file.   HPI Tiffany Ali is a 42 y.o. female with a past medical history of hypertension, who presents to ED for evaluation of right pinky finger pain and swelling after injury that occurred prior to arrival.  She was playing basketball when she jammed her finger.  Her family member straightened out her finger but she is continuing to have pain and swelling.  No previous fracture, dislocations or procedures in the area.  She did not take any medications prior to arrival to help with symptoms.  She denies any numbness in arm, symptoms prior to injury, fever, history of infected joint.  HPI  Past Medical History:  Diagnosis Date  . Hyperlipidemia   . Hypertension     Patient Active Problem List   Diagnosis Date Noted  . Annual physical exam 09/18/2016    Past Surgical History:  Procedure Laterality Date  . CESAREAN SECTION      OB History    No data available       Home Medications    Prior to Admission medications   Medication Sig Start Date End Date Taking? Authorizing Provider  carvedilol (COREG) 12.5 MG tablet Take 1 tablet (12.5 mg total) by mouth 2 (two) times daily with a meal. 11/01/16   Clent Demark, PA-C  losartan (COZAAR) 100 MG tablet Take 1 tablet (100 mg total) by mouth daily. 12/01/16   Clent Demark, PA-C  naproxen (NAPROSYN) 500 MG tablet Take 1 tablet (500 mg total) by mouth 2 (two) times daily. 05/26/17   Vermell Madrid, PA-C  simvastatin (ZOCOR) 20 MG tablet Take 1 tablet (20 mg total) by mouth at bedtime. 12/01/16   Clent Demark, PA-C    Family History Family History  Problem Relation Age of Onset  . Hypertension Mother     Social History Social History   Tobacco Use  . Smoking status: Never Smoker  . Smokeless tobacco: Never Used  Substance Use Topics  .  Alcohol use: Not on file  . Drug use: Not on file     Allergies   Patient has no known allergies.   Review of Systems Review of Systems  Constitutional: Negative for chills and fever.  Musculoskeletal: Positive for arthralgias and joint swelling. Negative for back pain, gait problem, neck pain and neck stiffness.  Skin: Negative for color change and wound.  Neurological: Negative for weakness and numbness.     Physical Exam Updated Vital Signs BP (!) 166/87 (BP Location: Right Arm)   Pulse 75   Temp 98.8 F (37.1 C) (Oral)   Resp 18   LMP 05/21/2017   SpO2 100%   Physical Exam  Constitutional: She appears well-developed and well-nourished. No distress.  Nontoxic-appearing and in no acute distress.  Pleasant.  HENT:  Head: Normocephalic and atraumatic.  Eyes: Conjunctivae and EOM are normal. No scleral icterus.  Neck: Normal range of motion.  Pulmonary/Chest: Effort normal. No respiratory distress.  Musculoskeletal: Normal range of motion. She exhibits tenderness. She exhibits no edema or deformity.  Tenderness to palpation of the PIP and MCP joints of the right fifth digit.  No edema or open wound noted.  Able to flex and extend the digit although pain with flexion.  No palmar tenderness or discoloration noted.  Sensation intact to light touch of bilateral upper  extremities.  2+ radial pulse noted bilaterally.  Neurological: She is alert.  Skin: No rash noted. She is not diaphoretic.  Psychiatric: She has a normal mood and affect.  Nursing note and vitals reviewed.    ED Treatments / Results  Labs (all labs ordered are listed, but only abnormal results are displayed) Labs Reviewed - No data to display  EKG  EKG Interpretation None       Radiology Dg Finger Little Right  Result Date: 05/25/2017 CLINICAL DATA:  Right fifth finger pain after basketball injury. EXAM: RIGHT LITTLE FINGER 2+V COMPARISON:  None. FINDINGS: There is no evidence of fracture or  dislocation. There is no evidence of arthropathy or other focal bone abnormality. Soft tissues are unremarkable. IMPRESSION: No acute osseous abnormality of the right fifth finger. Electronically Signed   By: Ashley Royalty M.D.   On: 05/25/2017 22:35    Procedures Procedures (including critical care time)  Medications Ordered in ED Medications  naproxen (NAPROSYN) tablet 500 mg (not administered)     Initial Impression / Assessment and Plan / ED Course  I have reviewed the triage vital signs and the nursing notes.  Pertinent labs & imaging results that were available during my care of the patient were reviewed by me and considered in my medical decision making (see chart for details).     Patient presents to ED for evaluation of right fifth digit pain after jamming it while playing basketball prior to arrival.  She does have tenderness to palpation of the PIP and MCP joints but able to perform range of motion.  No open wounds, color or temperature change of joints that would make me suspicious for a septic joint or vascular cause.  No previous fracture, dislocation or procedure in the area.  She is overall well-appearing.  X-rays were negative for acute osseous abnormality.  I suspect contusion being the cause of her symptoms.  Will give anti-inflammatories, follow-up with hand specialist and finger splint for support.  Patient appears stable for discharge at this time.  Strict return precautions given.  Final Clinical Impressions(s) / ED Diagnoses   Final diagnoses:  Injury of finger of right hand, initial encounter    ED Discharge Orders        Ordered    naproxen (NAPROSYN) 500 MG tablet  2 times daily     05/26/17 0047     Portions of this note were generated with Dragon dictation software. Dictation errors may occur despite best attempts at proofreading.    Delia Heady, PA-C 05/26/17 0867    Orpah Greek, MD 05/26/17 [phone]

## 2017-05-26 NOTE — Discharge Instructions (Signed)
Take naproxen as needed for pain and swelling. Wear finger splint as directed. Follow-up with hand specialist listed below for further evaluation if your symptoms persist for the next 2-3 weeks. Return to ED for worsening symptoms, redness or tender joint, additional injuries, numbness in hand.   Tome naproxeno segn sea necesario para el dolor y la hinchazn. Use la frula para los dedos como se indica. Haga un seguimiento con Human resources officer en manos que se indica a continuacin para una evaluacin adicional si sus sntomas persisten durante las prximas 2 a 3 semanas. Regrese a la ED para [place] sntomas, enrojecimiento o Radiation protection practitioner las articulaciones, lesiones adicionales, adormecimiento en la mano.

## 2017-06-21 ENCOUNTER — Encounter (INDEPENDENT_AMBULATORY_CARE_PROVIDER_SITE_OTHER): Payer: Self-pay | Admitting: Physician Assistant

## 2017-06-22 ENCOUNTER — Encounter (INDEPENDENT_AMBULATORY_CARE_PROVIDER_SITE_OTHER): Payer: Self-pay | Admitting: Physician Assistant

## 2017-07-19 ENCOUNTER — Encounter (INDEPENDENT_AMBULATORY_CARE_PROVIDER_SITE_OTHER): Payer: Self-pay | Admitting: Physician Assistant

## 2017-07-19 ENCOUNTER — Ambulatory Visit (INDEPENDENT_AMBULATORY_CARE_PROVIDER_SITE_OTHER): Payer: Self-pay | Admitting: Physician Assistant

## 2017-07-19 VITALS — BP 142/88 | HR 105 | Temp 99.5°F | Resp 18 | Ht 60.0 in | Wt 173.0 lb

## 2017-07-19 DIAGNOSIS — E785 Hyperlipidemia, unspecified: Secondary | ICD-10-CM

## 2017-07-19 DIAGNOSIS — S6991XA Unspecified injury of right wrist, hand and finger(s), initial encounter: Secondary | ICD-10-CM

## 2017-07-19 DIAGNOSIS — I10 Essential (primary) hypertension: Secondary | ICD-10-CM

## 2017-07-19 MED ORDER — CARVEDILOL 12.5 MG PO TABS: 12.5000 mg | ORAL_TABLET | Freq: Two times a day (BID) | ORAL | 1 refills | Status: DC

## 2017-07-19 MED ORDER — NAPROXEN 500 MG PO TABS: 500.0000 mg | ORAL_TABLET | Freq: Two times a day (BID) | ORAL | 0 refills | Status: DC

## 2017-07-19 MED ORDER — ACETAMINOPHEN 500 MG PO TABS: 1000.0000 mg | ORAL_TABLET | Freq: Three times a day (TID) | ORAL | 0 refills | Status: DC | PRN

## 2017-07-19 MED ORDER — LOSARTAN POTASSIUM 100 MG PO TABS: 100.0000 mg | ORAL_TABLET | Freq: Every day | ORAL | 1 refills | Status: DC

## 2017-07-19 MED ORDER — SIMVASTATIN 20 MG PO TABS: 20.0000 mg | ORAL_TABLET | Freq: Every day | ORAL | 1 refills | Status: DC

## 2017-07-19 NOTE — Progress Notes (Signed)
Subjective:  Patient ID: Tiffany Ali, female    DOB: 01-07-1976  Age: 42 y.o. MRN: [id number]  CC: Finger injury  HPI Tiffany Ali is a 42 y.o. female with a medical history of HTN and HLD presents with pain and cramping along the right fifth digit. Had a hyperflexion injury of the right fifth digit from the PIP and distally. Husband accompanies patient and says he had to bend the finger back to its original position. Went to the ED 44 days ago and XR was negative. Discharged on NSAID and splint. Patient admits to not using splint as it interfered with her daily dexterity. Says the PIP is tender but the finger is generally tender. No other symptoms or complaints. Needs a refill of her chronic disease meds.     Outpatient Medications Prior to Visit  Medication Sig Dispense Refill  . carvedilol (COREG) 12.5 MG tablet Take 1 tablet (12.5 mg total) by mouth 2 (two) times daily with a meal. 90 tablet 1  . losartan (COZAAR) 100 MG tablet Take 1 tablet (100 mg total) by mouth daily. 90 tablet 1  . naproxen (NAPROSYN) 500 MG tablet Take 1 tablet (500 mg total) by mouth 2 (two) times daily. 30 tablet 0  . simvastatin (ZOCOR) 20 MG tablet Take 1 tablet (20 mg total) by mouth at bedtime. 90 tablet 1   No facility-administered medications prior to visit.      ROS Review of Systems  Constitutional: Negative for chills, fever and malaise/fatigue.  Eyes: Negative for blurred vision.  Respiratory: Negative for shortness of breath.   Cardiovascular: Negative for chest pain and palpitations.  Gastrointestinal: Negative for abdominal pain and nausea.  Genitourinary: Negative for dysuria and hematuria.  Musculoskeletal: Positive for joint pain. Negative for myalgias.  Skin: Negative for rash.  Neurological: Negative for tingling and headaches.  Psychiatric/Behavioral: Negative for depression. The patient is not nervous/anxious.     Objective:  BP (!) 142/88 (BP Location: Left Arm, Patient  Position: Sitting, Cuff Size: Large)   Pulse (!) 105   Temp 99.5 F (37.5 C) (Oral)   Resp 18   Ht 5' (1.524 m)   Wt 173 lb (78.5 kg)   LMP 07/19/2017   SpO2 100%   BMI 33.79 kg/m   BP/Weight 07/19/2017 05/26/2017 9/98/3382  Systolic BP 505 397 673  Diastolic BP 88 419 80  Wt. (Lbs) 173 - 173  BMI 33.79 - 33.23      Physical Exam  Constitutional: She is oriented to person, place, and time.  Well developed, well nourished, NAD, polite  HENT:  Head: Normocephalic and atraumatic.  Pulmonary/Chest: Effort normal.  Musculoskeletal: She exhibits no edema.  Slight erythema and slight edema of the DIP of the right fifth finger. Full aROM of the fifth digit with pain elicited on manipulation and flexion/extension of PIP and DIP.  Neurological: She is alert and oriented to person, place, and time.  Skin: Skin is warm and dry. No rash noted. No erythema. No pallor.  Psychiatric: She has a normal mood and affect. Her behavior is normal. Thought content normal.  Vitals reviewed.    Assessment & Plan:   1. Injury of finger of right hand, initial encounter - Refill naproxen (NAPROSYN) 500 MG tablet; Take 1 tablet (500 mg total) by mouth 2 (two) times daily with a meal.  Dispense: 60 tablet; Refill: 0 - Begin acetaminophen (TYLENOL) 500 MG tablet; Take 2 tablets (1,000 mg total) by mouth every 8 (eight) hours as  needed.  Dispense: 90 tablet; Refill: 0 - Use splint as directed.   2. Hyperlipidemia, unspecified hyperlipidemia type -Refill simvastatin (ZOCOR) 20 MG tablet; Take 1 tablet (20 mg total) by mouth at bedtime.  Dispense: 90 tablet; Refill: 1  3. Hypertension, unspecified type -Refill losartan (COZAAR) 100 MG tablet; Take 1 tablet (100 mg total) by mouth daily.  Dispense: 90 tablet; Refill: 1  4. Essential hypertension - Refill carvedilol (COREG) 12.5 MG tablet; Take 1 tablet (12.5 mg total) by mouth 2 (two) times daily with a meal.  Dispense: 90 tablet; Refill: 1   Meds  ordered this encounter  Medications  . naproxen (NAPROSYN) 500 MG tablet    Sig: Take 1 tablet (500 mg total) by mouth 2 (two) times daily with a meal.    Dispense:  60 tablet    Refill:  0    Order Specific Question:   Supervising Provider    Answer:   Tresa Garter [id number]  . acetaminophen (TYLENOL) 500 MG tablet    Sig: Take 2 tablets (1,000 mg total) by mouth every 8 (eight) hours as needed.    Dispense:  90 tablet    Refill:  0    Order Specific Question:   Supervising Provider    Answer:   Tresa Garter [id number]  . simvastatin (ZOCOR) 20 MG tablet    Sig: Take 1 tablet (20 mg total) by mouth at bedtime.    Dispense:  90 tablet    Refill:  1    Order Specific Question:   Supervising Provider    Answer:   Tresa Garter [id number]  . losartan (COZAAR) 100 MG tablet    Sig: Take 1 tablet (100 mg total) by mouth daily.    Dispense:  90 tablet    Refill:  1    Order Specific Question:   Supervising Provider    Answer:   Tresa Garter [id number]  . carvedilol (COREG) 12.5 MG tablet    Sig: Take 1 tablet (12.5 mg total) by mouth 2 (two) times daily with a meal.    Dispense:  90 tablet    Refill:  1    Order Specific Question:   Supervising Provider    Answer:   Tresa Garter [[id number]]    Follow-up: Return if symptoms worsen or fail to improve.   Clent Demark PA

## 2018-01-10 ENCOUNTER — Encounter (INDEPENDENT_AMBULATORY_CARE_PROVIDER_SITE_OTHER): Payer: Self-pay | Admitting: Physician Assistant

## 2018-01-11 ENCOUNTER — Other Ambulatory Visit (INDEPENDENT_AMBULATORY_CARE_PROVIDER_SITE_OTHER): Payer: Self-pay | Admitting: Physician Assistant

## 2018-01-11 DIAGNOSIS — E785 Hyperlipidemia, unspecified: Secondary | ICD-10-CM

## 2018-01-11 DIAGNOSIS — I10 Essential (primary) hypertension: Secondary | ICD-10-CM

## 2018-01-11 MED ORDER — LOSARTAN POTASSIUM 100 MG PO TABS
100.0000 mg | ORAL_TABLET | Freq: Every day | ORAL | 1 refills | Status: AC
Start: 2018-01-11 — End: ?

## 2018-01-11 MED ORDER — CARVEDILOL 12.5 MG PO TABS: 12.5000 mg | ORAL_TABLET | Freq: Two times a day (BID) | ORAL | 1 refills | Status: DC

## 2018-01-11 MED ORDER — SIMVASTATIN 20 MG PO TABS: 20.0000 mg | ORAL_TABLET | Freq: Every day | ORAL | 1 refills | Status: DC

## 2018-01-14 ENCOUNTER — Other Ambulatory Visit (HOSPITAL_COMMUNITY): Payer: Self-pay | Admitting: *Deleted

## 2018-01-14 DIAGNOSIS — Z0000 Encounter for general adult medical examination without abnormal findings: Secondary | ICD-10-CM

## 2018-01-15 NOTE — Addendum Note (Signed)
Addended by: Unice Bailey B on: 01/15/2018 12:24 PM   Modules accepted: Orders

## 2018-01-16 ENCOUNTER — Inpatient Hospital Stay: Payer: Self-pay

## 2018-01-16 ENCOUNTER — Inpatient Hospital Stay: Payer: Self-pay | Attending: Obstetrics and Gynecology | Admitting: *Deleted

## 2018-01-16 VITALS — BP 118/70 | Ht 60.0 in | Wt 175.0 lb

## 2018-01-16 DIAGNOSIS — Z0000 Encounter for general adult medical examination without abnormal findings: Secondary | ICD-10-CM

## 2018-01-16 LAB — LIPID PANEL
CHOLESTEROL: 203 mg/dL — AB (ref 0–200)
HDL: 49 mg/dL (ref >40)
LDL Cholesterol: 125 mg/dL — ABNORMAL HIGH (ref 0–99)
Total CHOL/HDL Ratio: 4.1 RATIO
Triglycerides: 147 mg/dL (ref <150)
VLDL: 29 mg/dL (ref 0–40)

## 2018-01-16 LAB — HEMOGLOBIN A1C
Hgb A1c MFr Bld: 5.2 % (ref 4.8–5.6)
MEAN PLASMA GLUCOSE: 102.54 mg/dL

## 2018-01-16 NOTE — Progress Notes (Signed)
[facility] initial screening  Clinical Measurement:  Height: 60in Weight: 175lb.  Blood Pressure: 110/70 Blood Pressure #2: 118/70  Fasting Labs Drawn Today, will review with patient when they result.  Medical History:  Patient states that she has  been diagnosed with high cholesterol and  high blood pressure, but has not been diagnosed with diabetes or heart disease.  Medications:  Patients states she is  taking medications for high cholesterol and  high blood pressure. Patient states she is not taking medication for  diabetes.  She is not taking aspirin daily to prevent heart attack or stroke.    Blood pressure, self measurement:  Patients states she does  measure blood pressure at home but she does not share readings with a health care professional.  Nutrition:  Patient states she eats 0 cups of fruit and 1 cup of vegetables in an average day.  Patient states she does not eat fish regularly, she eats less than half a serving of whole grains daily. She drinks less than 36 ounces of beverages with added sugar weekly.  She is currently watching her sodium intake.  She has not had any drinks containing alcohol in the last seven days.    Physical activity:  Patient states that she gets 0 minutes of moderate exercise in a week.  She gets 0 minutes of vigorous exercise per week.   Smoking status:  Patient states she has never smoked and is not around any smokers.     Quality of life:  Patient states that she has had 0 bad physical days out of the last 30 days. In the last 2 weeks, she has had 0 days that she has felt down or depressed. She has had 0 days in the last 2 weeks that she has had little interest or pleasure in doing things.  Risk reduction and counseling:  Patient states she wants to lose weight and increase fruit and vegetable intake.  I encouraged her to continue with current exercise regimen and increase vegetable and fruit intake.  Navigation:  I will notify patient of lab results.   Patient is aware of 2 more health coaching sessions and a follow up.

## 2018-01-23 ENCOUNTER — Telehealth (HOSPITAL_COMMUNITY): Payer: Self-pay | Admitting: *Deleted

## 2018-01-23 NOTE — Telephone Encounter (Signed)
Health coaching 2  Labs-cholesterol 203, LDL cholesterol 125,triglycerides 147, HDL cholesterol 49, hemoglobin A1C 5.2, mean plasma 102.54  Patient is aware and understands these lab results.  Goals- Patient stated her goal of weight loss.  I encouraged her to choose lean meats, skinless chicken and fish(tuna,mackerol and salmon) and to avoid most prepackaged food.  I encouraged her to exercise at least 150 minutes weekly.  Navigation:  Patient is aware of 1 more health coaching sessions and a follow up.   Time-  10 mintes

## 2018-02-15 ENCOUNTER — Telehealth (HOSPITAL_COMMUNITY): Payer: Self-pay | Admitting: *Deleted

## 2018-02-15 NOTE — Telephone Encounter (Signed)
Health coaching 3   Spanish interpreter-  Rudene Anda, Language Resources  Goals- Patient states that she is walking 30 minutes 3 - 4 times weekly.  I encouraged her to exercise at least 150 minutes.  Patient states she is eating more fruits and vegetables.  I encouraged her to eat at least 5 fruits and vegetables ,lean meats and to eat low fat dairy.  Navigation:  Patient is aware of a follow up.  Time- 10 minutes

## 2018-04-17 ENCOUNTER — Telehealth (HOSPITAL_COMMUNITY): Payer: Self-pay | Admitting: *Deleted

## 2018-04-17 NOTE — Telephone Encounter (Signed)
[facility] Follow Up  Medical History:  Patient states that she has not been diagnosed with high cholesterol,diabetes or heart disease.  Patient states that she has been diagnosed with high blood pressure.  Medications:  Patients states she is not taking any medications for high cholesterol,  or diabetes.  She is not taking aspirin daily to prevent heart attack or stroke.  Patient states that she is taking high blood pressure medication.  Blood pressure, self measurement:  Patients states she does  measure blood pressure at home, that she shares with her provider.  Nutrition:  Patient states she eats 1 cup of fruit and 1 cup of vegetables in an average day.  Patient states she does  eat fish regularly, she eats more than half a serving of whole grains daily. She drinks less than 36 ounces of beverages with added sugar weekly.  She is currently watching her sodium intake.  She has not had any drinks containing alcohol in the last seven days.      Physical activity:  Patient states that she gets 180 minutes of moderate exercise in a week.  She gets 0 minutes of vigorous exercise per week.    Smoking status:  Patient states she has never smoked and is not around any smokers.    Quality of life:  Patient states that she has had 0 bad physical days out of the last 30 days. In the last 2 weeks, she has had 0 days that she has felt down or depressed. She has had 0 days in the last 2 weeks that she has had little interest or pleasure in doing things.

## 2018-04-17 NOTE — Telephone Encounter (Signed)
Note addendum, Spanish interpreter,  Etheleen Nicks Ratmiroff

## 2019-06-06 IMAGING — CR DG FINGER LITTLE 2+V*R*
3 series · 3 of 3 positions shown · non-contrast
Comparison: None.

CLINICAL DATA: Right fifth finger pain after basketball injury.

EXAM:
RIGHT LITTLE FINGER 2+V

[finger ap]
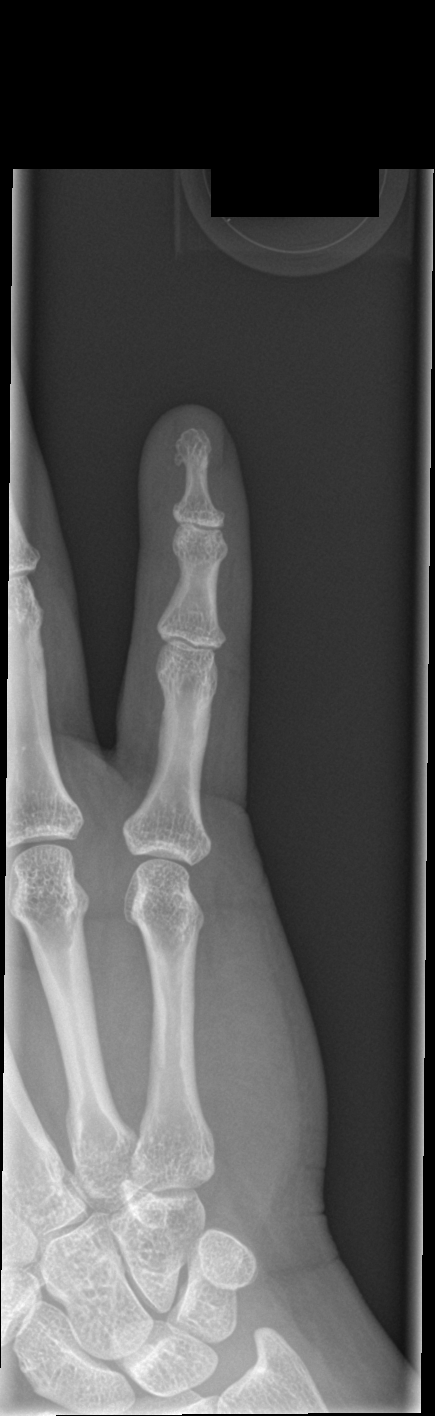

[finger obl]
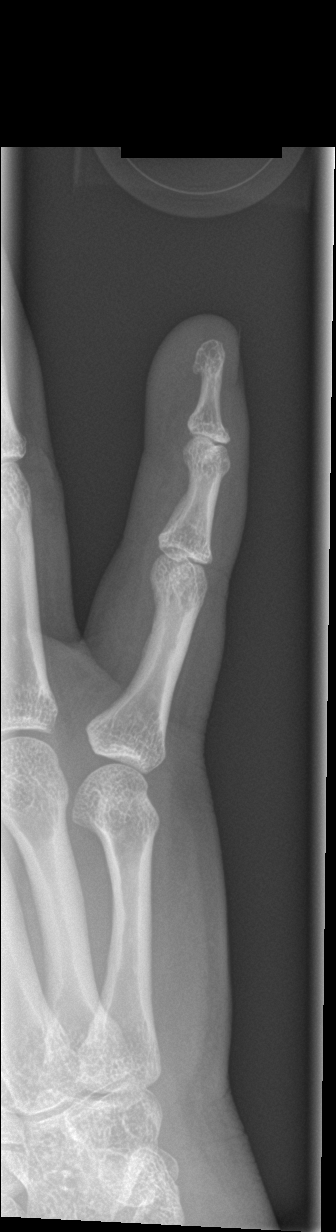

[finger lat]
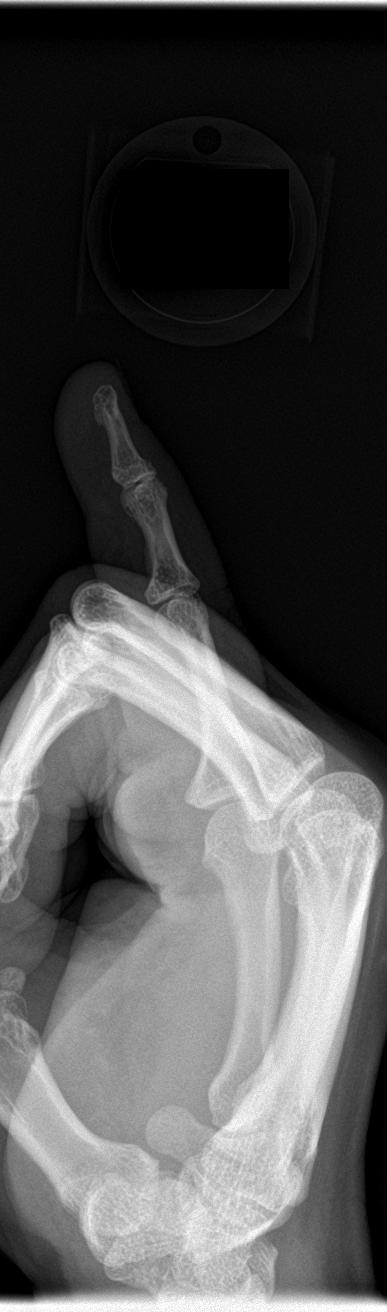

[3 of 3 positions shown; findings below may reference images not displayed]

FINDINGS: There is no evidence of fracture or dislocation. There is no
evidence of arthropathy or other focal bone abnormality. Soft
tissues are unremarkable.
IMPRESSION: No acute osseous abnormality of the right fifth finger.

## 2019-12-23 ENCOUNTER — Other Ambulatory Visit: Payer: Self-pay

## 2019-12-23 ENCOUNTER — Ambulatory Visit (INDEPENDENT_AMBULATORY_CARE_PROVIDER_SITE_OTHER): Payer: BC Managed Care – PPO | Admitting: Obstetrics and Gynecology

## 2019-12-23 ENCOUNTER — Encounter: Payer: Self-pay | Admitting: Obstetrics and Gynecology

## 2019-12-23 VITALS — BP 132/80 | Ht 62.0 in | Wt 162.0 lb

## 2019-12-23 DIAGNOSIS — N83202 Unspecified ovarian cyst, left side: Secondary | ICD-10-CM | POA: Diagnosis not present

## 2019-12-23 DIAGNOSIS — R1032 Left lower quadrant pain: Secondary | ICD-10-CM

## 2019-12-23 DIAGNOSIS — N83201 Unspecified ovarian cyst, right side: Secondary | ICD-10-CM | POA: Diagnosis not present

## 2019-12-23 DIAGNOSIS — D271 Benign neoplasm of left ovary: Secondary | ICD-10-CM | POA: Diagnosis not present

## 2019-12-23 DIAGNOSIS — N939 Abnormal uterine and vaginal bleeding, unspecified: Secondary | ICD-10-CM | POA: Diagnosis not present

## 2019-12-23 MED ORDER — MEDROXYPROGESTERONE ACETATE 10 MG PO TABS: 10.0000 mg | ORAL_TABLET | Freq: Every day | ORAL | 0 refills | Status: DC

## 2019-12-23 NOTE — Patient Instructions (Addendum)
Educacin para el paciente: Quistes ovricos (Conceptos Bsicos) [place] in [place] por los mdicos y editores de UpToDate Qu son los quistes ovricos?--Los quistes ovricos son pequeos sacos de lquido que se forman en la superficie o en el interior del ovario. Los ovarios son parte del sistema reproductor (figura 1). Si an tiene [employer], los ovarios expulsan un vulo aproximadamente una vez al mes. Cuando se enteran de que tienen quistes, a Regulatory affairs officer les preocupa la posibilidad de tener quistes ovricos, Armed forces training and education officer en la mayora de los [place] quistes no son cancerosos. Cules son los sntomas de los quistes ovricos?--Es posible que no tenga sntomas. Si los tiene, estos pueden Furniture conservator/restorer o presin en un lado de la parte baja del rea del estmago. El dolor puede ser sordo o punzante, y Audiological scientist y Armed forces operational officer. A veces, los quistes ovricos se abren o pueden hacer que el ovario se retuerza, lo cual puede ser un problema grave. Llame a su mdico de inmediato o vaya al departamento de emergencias si siente un dolor intenso que no desaparece en cualquiera de los [employer] de la parte baja del rea del [place]. Este tipo de dolor suele ser repentino y podra hacer que se retuerza o Hotel manager. Adems del dolor, es posible que sienta nuseas o que vomite, o que tenga un sangrado leve por la vagina. Cules son las causas de los quistes ovricos?--Las [employer] de los quistes ovricos son [place]. Las causas ms comunes incluyen: ?Ovulacin o embarazo - La ovulacin se produce cada mes cuando un vulo sale del ovario. Para que suceda eso, el ovario forma un saco llamado "folculo". A veces, el folculo se desarrolla pero no expulsa ningn vulo, sino que se forma un quiste. Tambin puede pasar que un quiste permanezca en el ovario durante semanas o meses si queda embarazada despus de expulsar el vulo. Este tipo de quiste no es daino y Microbiologist por s solo. ?Quistes dermoides - Estos quistes son muy comunes. A veces tienen dientes, pelos o grasa en el interior, lo cual tal vez suene bastante raro. Normalmente, los quistes dermoides no son perjudiciales para la salud, pero es posible que el mdico quiera sacarlos con [place]. ?Sndrome de ovario poliqustico - En este padecimiento, se desarrollan muchos quistes pequeos en el ovario, en lugar de un solo folculo grande que desaparece cada mes. Estos quistes por lo general no desaparecen, pero no es necesario tratarlos ni sacarlos, aunque a veces requieren un tratamiento con medicina por motivos que no tienen relacin con los quistes. ?Endometriosis - La endometriosis es un padecimiento que se produce cuando un tejido que normalmente se encuentra en el tero crece afuera del tero. Con la endometriosis tambin se pueden formar quistes en los ovarios. Las Advertising copywriter padecen endometriosis podran sufrir Media planner rea del estmago durante los periodos o en otros momentos, como al tener relaciones sexuales, o podran tener dificultad para quedar embarazadas. ?Cncer - El cncer es la causa de los quistes ovricos en menos de 1 de cada 100 casos. El cncer de ovario es ms probable en personas que ya pasaron por la menopausia (es decir, que ya no tienen periodos Agricultural engineer) o en cuyas familias hubo casos de cncer de ovario. Qu tamao tiene un quiste ovrico?--Por lo general, los quistes tienen [place] 1 y 2 pulgadas de ancho, Armed forces training and education officer pueden ser ms grandes. Existe alguna prueba para [employer]. [place] frecuentes se incluyen: ?Estudios de [place] -  El tipo ms comn es el ultrasonido plvico, que utiliza ondas de sonido para crear una imagen del tero y los ovarios. Con estas imgenes se puede observar si hay quistes, dnde estn y qu tamao tienen. Es posible que el mdico tambin tome fotografas con una resonancia magntica nuclear o una  tomografa. ?Pruebas de sangre para verificar si hay embarazo o posibilidades de cncer Cmo se tratan los quistes ovricos?--Eso depende de la causa de los quistes y de los sntomas que tenga. Los posibles tratamientos incluyen: ?Photographer - Es posible que el mdico [place] Optometrist un [employer]. Los quistes pueden conservar el mismo tamao, reducirse o Secondary school teacher, en cuyo caso generalmente no es necesario que haga nada para tratarlos. ?Pldoras anticonceptivas - Estas medicinas pueden evitar el crecimiento de algunos tipos de quistes. ?Ciruga para sacar un quiste o el ovario entero Qu sucede si quiero quedar embarazada?--Eso depende de la causa de los quistes ovricos. [place] [employer] de las personas con quistes pueden quedar embarazadas. Incluso si le quitan uno de los ovarios, generalmente an es posible que quede en [place]. Si no puede quedar embarazada, existen medicinas o nuevas tecnologas que pueden ayudar. Si desea tener un beb, hable con su mdico acerca de sus opciones. Ms informacin sobre este tema   Educacin para el paciente: Ciruga mnimamente invasiva (Conceptos Bsicos) View in [place] por los mdicos y editores de UpToDate There is a newer version of this topic available in [place]. Hay una versin de este artculo ms actualizada disponible en [place]. Qu es una ciruga mnimamente invasiva?--Una ciruga mnimamente invasiva es un tipo de [place] en la que se usan herramientas especiales diseadas para reducir el tamao de las incisiones y la cantidad de dao en los tejidos del [place]. En un tipo de [place] mnimamente invasiva se utiliza un dispositivo de observacin que permite que los cirujanos vean el interior del organismo sin abrirlo por completo. En otro tipo, llamado "ciruga endovascular", se usan radiografas para ver el interior del cuerpo mientras el cirujano emplea dispositivos especiales que caben dentro de los vasos  sanguneos. Este artculo trata sobre el tipo de [place] para el que se utilizan dispositivos de observacin. Existen distintos tipos de dispositivos de observacin, pero todos funcionan prcticamente de la [employer]. Son tubos largos y delgados con [place] cmara pequea y Hali Marry integradas en el extremo. La cmara enva imgenes del interior del cuerpo a una pantalla de televisin. Cuando se hace este tipo de [place], el Careers adviser un pequeo corte, lo suficientemente grande como para que entre el dispositivo de observacin. A continuacin, realiza dos o ms cortes por los que pueden pasar instrumentos delgados. Los instrumentos que se utilizan pueden ser pinzas, tijeras y dispositivos de sutura, que el cirujano puede manejar desde afuera del cuerpo. Mientras mira la imagen en la pantalla, el cirujano utiliza esos instrumentos para Customer service manager operacin. Cules son los diferentes tipos de ciruga mnimamente invasiva en los que se utilizan dispositivos de observacin?--Existen varios tipos diferentes (figura 1). Sus nombres estn relacionados con las partes del cuerpo que intervienen. Los dispositivos de observacin para los distintos tipos de [place] tambin se nombran segn la parte del cuerpo en donde se utilizan: ?Los toracoscopios se utilizan en el pecho para "cirugas toracoscpicas". ("Thorax" significa "pecho" en griego). Este tipo de [place] se puede Risk manager para Community education officer trozos de pulmn o para Optometrist ciertos tipos de [place] del corazn. ?Los laparoscopios se Personal assistant rea del estmago para "cirugas laparoscpicas". (En griego, "  lapara" es el espacio que hay entre la parte inferior de la caja torcica y las caderas). Este tipo de [place] se puede Risk manager para Heritage manager vescula biliar, el apndice o el tero, o para muchos otros procedimientos. ?Los histeroscopios se Personal assistant tero y la vagina para las "cirugas histeroscpicas". ("Histera" significa "tero" en griego). Este  tipo de [place] se puede usar para Systems developer del tero, o para Optometrist varios procedimientos diferentes en el tero y la vagina. ?Los artroscopios se utilizan dentro de las articulaciones para las "cirugas artroscpicas". ("Arthron" significa "articulacin" en griego). Este tipo de [place] se puede Risk manager para reparar o Tax adviser en la rodilla, el hombro y la cadera. ?Los endoscopios se colocan por la garganta y se Chief Strategy Officer por el esfago, el estmago o los intestinos (figura 2). ("Endo" significa "adentro" o "interior" en griego). [place] veces, estos dispositivos de observacin se usan solamente para inspeccionar esas partes del cuerpo. Otras veces, se utilizan para Air traffic controller. Esto puede consistir en retirar un pequeo trozo de tejido para Investment banker, corporate o en abrir una zona que se Electronics engineer.  ?Los colonoscopios se Pensions consultant recto y se hacen avanzar hasta el intestino grueso, o colon. Son similares a los endoscopios (figura 3). En algunas cirugas mnimamente invasivas se emplea un "robot" quirrgico, el cual consiste en una mquina controlada por el [place]. Este procedimiento tambin se denomina "ciruga robtica". En qu se diferencia la ciruga mnimamente invasiva de la ciruga convencional?--En general (pero no siempre), en este tipo de ciruga la recuperacin es ms sencilla. Esto se debe a lo siguiente: ?Por lo general se realizan varios cortes pequeos en lugar de uno grande ?El interior del organismo no queda tan expuesto al aire como en la ciruga "abierta" normal ?Los rganos no se desplazan tanto A pesar de todas estas diferencias, la ciruga mnimamente invasiva sigue siendo [place]. Las [place] [employer] se someten a ella sufren dolores, con frecuencia necesitan suturas y pueden desarrollar infecciones u otros problemas a causa de Diplomatic Services operational officer. Los pacientes siempre pueden optar por Ardelia Mems ciruga mnimamente  invasiva?--No. Insurance underwriter, muchos procedimientos se pueden realizar mediante una ciruga mnimamente invasiva, pero la eleccin del tipo de [place] no siempre depende del [place]. Que el paciente pueda someterse a este tipo de [place] depende de: ?Si hay un cirujano disponible que tenga experiencia suficiente para practicar el tipo de ciruga que necesita el paciente ?El motivo por el que el paciente necesita la ciruga (por [place], los pacientes que deben someterse a una ciruga para sacar tumores muy grandes no siempre pueden optar por procedimientos mnimamente invasivos) ?Los otros problemas de salud que tenga el paciente Incluso cuando a un paciente se le practica una ciruga mnimamente invasiva, no hay garantas de que la ciruga se realice con ese mtodo durante todo el procedimiento. A veces, los cirujanos comienzan con una ciruga mnimamente invasiva y [place] se dan cuenta de que deben practicar Ardelia Mems ciruga abierta. Esto no significa que el cirujano haya hecho algo mal; simplemente es algo que a veces sucede una vez que se inicia [place]. Si est por someterse a una ciruga mnimamente invasiva, est preparado para despertarse y enterarse de que se someti a [place]. Esto puede suceder por varios motivos diferentes, por ejemplo: ?El Geographical information systems officer algo inesperado al comenzar a operar ?El cirujano no poda ver bien o no poda tratar correctamente el rgano que se propona operar ?Se produjo un sangrado  que no se poda controlar con un mtodo mnimamente invasivo Lo que es importante recordar es que si un cirujano opta por realizar una ciruga abierta, generalmente es para proteger la seguridad del [place].

## 2019-12-23 NOTE — Progress Notes (Signed)
Suttyn Sabina Feb 16, 1976 [id number]  SUBJECTIVE:  44 y.o. G14P2003 female presents for evaluation of bilateral ovarian cysts. She presented to urgent care 12/10/2019 with lower abdominal pain for 1 day.  Pain is now more dull and occasionally throbs.  Mainly left sided lower abdominal pain, radiates a little into the back.  Pain is not particularly related to her menstrual cycle.  Her periods have been regular.  Her period lasts 7 days, usually normal flow, every 27 days, but this month she has continued to have bleeding since period started on 12/01/19.  Menstrual flow this month was normal, but has ongoing light flow since then.  She has not been on any hormonal birth control.  Previous tubal ligation 17 years ago.  Had noticed some nausea but no vomiting.  2 C-sections (1 set of twins). Urgent care work-up included an abdominal x-ray with no evidence of constipation, and a CT of the abdomen pelvis which per the imaging report in care everywhere indicated bilateral ovarian cysts, the left ovary having what appeared to be a dermoid measuring 6.6 x 6.1 x 5.6 cm, and the right ovary having a 3.7 x 2.9 x 3.2 cm complex cyst that was interpreted as possible hemorrhagic cyst or endometrioma.  Uterus also had a 2.6 cm subserosal fibroid on the right aspect. No history of ovarian, breast, or uterine cancer in family. Mother had a hysterectomy for benign fibroids and pelvic organ prolapse.  Professional Spanish interpreter is present via phone today for the encounter.  Current Outpatient Medications  Medication Sig Dispense Refill  . acetaminophen (TYLENOL) 500 MG tablet Take 2 tablets (1,000 mg total) by mouth every 8 (eight) hours as needed. 90 tablet 0  . amLODipine (NORVASC) 2.5 MG tablet Take 1 tablet by mouth daily.    Marland Kitchen losartan (COZAAR) 100 MG tablet Take 1 tablet (100 mg total) by mouth daily. 90 tablet 1  . naproxen (NAPROSYN) 500 MG tablet Take 1 tablet (500 mg total) by mouth 2 (two) times daily  with a meal. 60 tablet 0   No current facility-administered medications for this visit.   Allergies: Patient has no known allergies.  Patient's last menstrual period was 12/01/2019.  Past medical history,surgical history, problem list, medications, allergies, family history and social history were all reviewed and documented as reviewed in the EPIC chart.  ROS:  Feeling well. No dyspnea or chest pain on exertion.  No abdominal pain, change in bowel habits, black or bloody stools.  No urinary tract symptoms. GYN ROS: As described in HPI.  No neurological complaints.   OBJECTIVE:  BP 132/80 (BP Location: Right Arm, Patient Position: Sitting, Cuff Size: Normal)   Ht 5\' 2"  (1.575 m)   Wt 162 lb (73.5 kg)   LMP 12/01/2019   BMI 29.63 kg/m  The patient appears well, alert, oriented x 3, in no distress. PELVIC EXAM: Deferred to next visit  ASSESSMENT:  44 y.o. G2P2003 here for evaluation of bilateral ovarian cysts, left lower abdominal pain, abnormal uterine bleeding  PLAN:  1.  Bilateral ovarian cysts We discussed differential diagnosis of ovarian cysts to include functional cysts from ovulation (simple, hemorrhagic), endometriomas, benign tumors (such as dermoid).  It is possible her right ovarian cyst could be a functional cyst versus endometrioma based on the imaging report.  It sounds like the left ovarian cyst is larger and likely causing her abdominal pain, with the appearance of a dermoid by CT scan.  We discussed that ultrasound is the optimal imaging  modality for evaluating adnexa and ovarian cysts.  We discussed dermoid and similar cysts do not resolve on their own and typically continue to grow and may cause more symptoms and also increased risk for ovarian torsion as they do so.  Rarely is there a risk for malignancy with a complex cyst in the premenopausal age group, so I also recommended checking tumor markers (CA-125, AFP, LDH, hCG) to more thoroughly reassure [place] that the cyst is  benign.  Treatment for symptomatic dermoid cysts would be surgical resection via laparoscopy, which we very briefly discussed today.  I did provide her with up-to-date information printouts on ovarian cysts and minimally invasive surgery (in Spanish).  We will see her back for an ultrasound appointment.  2.  Abnormal uterine bleeding  This sounds to be an isolated episode.  Generally she has normal and regular periods.  Very little concern for pregnancy given previous tubal ligation history (we will be checking hCG on tumor marker panel anyway).  We discussed perimenopausal transition signs/symptoms of which she does not really have many of at this point.  We discussed various potential causes for AUB.  We will start by a checking TSH.  I offered her Provera 10 mg daily treatment for 10 days to help reset the endometrial lining and she was interested in doing this so I gave her prescription.  Potential side effects are discussed.    Joseph Pierini MD 12/23/19

## 2019-12-24 LAB — HCG, TOTAL, QUANTITATIVE: hCG, Beta Chain, Quant, S: <3 m[IU]/mL

## 2019-12-24 LAB — LACTATE DEHYDROGENASE: LDH: 143 U/L (ref 100–200)

## 2019-12-24 LAB — AFP TUMOR MARKER: AFP-Tumor Marker: 2.3 ng/mL

## 2019-12-24 LAB — CA 125: CA 125: 9 U/mL (ref <35)

## 2019-12-24 LAB — TSH: TSH: 1.3 m[IU]/L

## 2019-12-25 ENCOUNTER — Other Ambulatory Visit: Payer: Self-pay

## 2019-12-25 ENCOUNTER — Encounter: Payer: Self-pay | Admitting: Obstetrics and Gynecology

## 2019-12-25 ENCOUNTER — Ambulatory Visit (INDEPENDENT_AMBULATORY_CARE_PROVIDER_SITE_OTHER): Payer: BC Managed Care – PPO

## 2019-12-25 ENCOUNTER — Ambulatory Visit: Payer: BC Managed Care – PPO | Admitting: Obstetrics and Gynecology

## 2019-12-25 VITALS — BP 134/80

## 2019-12-25 DIAGNOSIS — N83202 Unspecified ovarian cyst, left side: Secondary | ICD-10-CM | POA: Diagnosis not present

## 2019-12-25 DIAGNOSIS — R1032 Left lower quadrant pain: Secondary | ICD-10-CM

## 2019-12-25 DIAGNOSIS — N854 Malposition of uterus: Secondary | ICD-10-CM

## 2019-12-25 DIAGNOSIS — D219 Benign neoplasm of connective and other soft tissue, unspecified: Secondary | ICD-10-CM

## 2019-12-25 DIAGNOSIS — D271 Benign neoplasm of left ovary: Secondary | ICD-10-CM | POA: Diagnosis not present

## 2019-12-25 DIAGNOSIS — N83201 Unspecified ovarian cyst, right side: Secondary | ICD-10-CM

## 2019-12-25 DIAGNOSIS — N939 Abnormal uterine and vaginal bleeding, unspecified: Secondary | ICD-10-CM

## 2019-12-25 NOTE — Progress Notes (Signed)
Tiffany Ali February 02, 1976 [id number]  SUBJECTIVE:  44 y.o. G39P3003 female presents for pelvic ultrasound for suspected left dermoid cyst.  She had tumor markers drawn at her initial appointment 2 days ago which were all normal.  Her pain and pressure symptoms are slightly better today compared to the other day.  Professional Spanish interpreter present for the visit today.  Current Outpatient Medications  Medication Sig Dispense Refill  . acetaminophen (TYLENOL) 500 MG tablet Take 2 tablets (1,000 mg total) by mouth every 8 (eight) hours as needed. 90 tablet 0  . amLODipine (NORVASC) 2.5 MG tablet Take 1 tablet by mouth daily.    Marland Kitchen losartan (COZAAR) 100 MG tablet Take 1 tablet (100 mg total) by mouth daily. 90 tablet 1  . medroxyPROGESTERone (PROVERA) 10 MG tablet Take 1 tablet (10 mg total) by mouth daily. 10 tablet 0  . naproxen (NAPROSYN) 500 MG tablet Take 1 tablet (500 mg total) by mouth 2 (two) times daily with a meal. 60 tablet 0   No current facility-administered medications for this visit.   Allergies: Patient has no known allergies.  Patient's last menstrual period was 12/01/2019.  Past medical history,surgical history, problem list, medications, allergies, family history and social history were all reviewed and documented as reviewed in the EPIC chart.  ROS:  Feeling well. No dyspnea or chest pain on exertion.  No abdominal pain, change in bowel habits, black or bloody stools.  No urinary tract symptoms. + LLQ pain (more mild today)   OBJECTIVE:  BP 134/80 (BP Location: Right Arm, Patient Position: Sitting, Cuff Size: Normal)   LMP 12/01/2019  The patient appears well, alert, oriented x 3, in no distress. PELVIC EXAM: Deferred to preop visit  Pelvic ultrasound Anteverted uterus 10.0 x 7.0 x 5.7 cm, multiple fibroids noted, largest measuring 2.6 cm, intramural and subserosal locations. Symmetrical endometrium 6.2 mm.  No masses.  Right ovary with multiple small follicles,  largest measuring 1.7 cm.  All avascular. Left ovary with large cystic mass 7.4 x 6.7 cm, internal echoes with solid components, avascular.  Likely consistent with dermoid cyst. No other adnexal masses.  No free fluid.  ASSESSMENT:  44 y.o. [id number] here for operative consultation regarding proposed laparoscopy to excise a left ovarian dermoid cyst  PLAN:  The patient is reassured by the negative tumor markers (CA-125, AFP, LDH, hCG) and the ultrasound appearance consistent with a dermoid cyst, which is most likely benign.  We discussed the very rare instances of a malignant tumors with similar appearance, but the normal tumor markers are reassuring.  She is having left lower quadrant pain and symptoms presumably due to the cyst.  I did recommend proceeding with operative laparoscopy for excision of the left dermoid cyst, and drainage/removal of the right ovarian cyst.  She also has multiple small fibroids in the uterus, but she is not having excessively heavy periods, just a recent episode of abnormal bleeding, and she is taking Provera as prescribed last time and noticing a reduction in the bleeding at this point.  TSH was normal.  We did briefly discuss fibroids and if they are asymptomatic then no specific treatment is needed.  I would recommend follow-up and yearly pelvic exams to keep track of uterine size to ensure they are not growing. I discussed the pros scopic ovarian cyst excision procedure in detail with the patient including the length of time anticipated to perform the procedure, the expectation for same-day discharge, and postoperative convalescence and recovery expectations.  I  discussed that she will need to plan to be off of work for potentially up to 6 weeks but may be able go back sooner a limited capacity keeping in mind lifting restrictions of 10 to 20 lb and no repetitive squatting, bending, or straining maneuvers.  Discussed the expected discomforts after laparoscopy.  Pelvic rest  restriction was also reviewed.  There are intraoperative risks of infection, bleeding, possibility of needing a blood transfusion, injury to bowel, bladder, major pelvic vessels, ureter, nerves from positioning and or skin irritation, and deep venous thrombosis.  There is possibility of losing the left ovary with a dermoid if the resection is extensive and there is deemed to be too much damage to that ovary, and/or if hemostasis cannot be obtained.  The risk of inadvertent injury to internal organs either immediately recognized or delay in recognition necessitating major exploratory reparative surgeries and future reparative surgeries including bowel resection, ostomy formation, bladder repair, ureteral damage repair was all discussed with her.  General anesthesia also has risks including myocardial infarction, stroke, and death.  Common scenarios for complications from surgery were reviewed with the patient including focus on bladder and/or ureteral injuries.  Incisional complications to include opening and draining of incisions and closure by secondary intention, dehiscence, and hernia formation were reviewed. Generally the complication rate is less than 1%.  Conversion to laparotomy may be deemed necessary at the time of the procedure, which if performed would result in longer hospital stay, and more postoperative pain and healing time.   She will read the information that was provided to her regarding laparoscopy and dermoid cysts.  I will have staff reach out to her to schedule the surgery and a preoperative visit at which time we will plan to perform a pelvic exam.  All questions were answered by the end of the visit.   Joseph Pierini MD 12/25/19

## 2019-12-29 ENCOUNTER — Telehealth: Payer: Self-pay

## 2019-12-29 NOTE — Telephone Encounter (Signed)
Rosemarie Ax called patient and discussed the following with her in Spanish.  "I checked her ins benefits and with her BCBS she has a $2500 deductible with -0- met yet. She has an 80/20 co-insurance and out of pocket maximum of $5000. Her estimated surgery fee is $1737.00 for Dr. Raliegh Ip and his assistant is $347.00 for a total of [phone]. [facility] will ask her to pay that amount by one week before surgery. Please advise her that if [facility] calls her about hospital bill do not pay them this portion of her deductible a second time. Let then know she is paying this amount toward her deductible due. I will also mail her a Ambulance person.   First available date is Monday Sept 13 at 7:30am.  Second available date is Wed Sept 15 at 9:00am.   She will need to go 4 days prior for a Covid test and will be required to quarantine from the time of the test until her surgery. Testing site is [facility], [place], [place]. I will send her a sheet about Covid as well as a map to the site.   I will also mail her a pamphlet about [facility] with a map, ph #, address, pic of building, etc.   She will need in person pre op visit with Dr. Delilah Shan. "

## 2019-12-29 NOTE — Telephone Encounter (Signed)
I received message from Rosemarie Ax "She wants September 13 and wants a morning appointment for Covid testing and her pre op has been scheduled for September 8 at 10:30."     Covid test appt scheduled 01/29/20 at 9:10am. Rosemarie Ax will notify her and I will mail her a sheet on Covid with a map.

## 2020-01-06 ENCOUNTER — Encounter: Payer: Self-pay | Admitting: Gynecology

## 2020-01-28 ENCOUNTER — Encounter: Payer: Self-pay | Admitting: Obstetrics and Gynecology

## 2020-01-28 ENCOUNTER — Encounter (HOSPITAL_BASED_OUTPATIENT_CLINIC_OR_DEPARTMENT_OTHER): Payer: Self-pay | Admitting: Obstetrics and Gynecology

## 2020-01-28 ENCOUNTER — Ambulatory Visit (INDEPENDENT_AMBULATORY_CARE_PROVIDER_SITE_OTHER): Payer: BC Managed Care – PPO | Admitting: Obstetrics and Gynecology

## 2020-01-28 ENCOUNTER — Other Ambulatory Visit: Payer: Self-pay

## 2020-01-28 VITALS — BP 122/80

## 2020-01-28 DIAGNOSIS — N83201 Unspecified ovarian cyst, right side: Secondary | ICD-10-CM

## 2020-01-28 DIAGNOSIS — D271 Benign neoplasm of left ovary: Secondary | ICD-10-CM

## 2020-01-28 DIAGNOSIS — N83202 Unspecified ovarian cyst, left side: Secondary | ICD-10-CM

## 2020-01-28 DIAGNOSIS — D219 Benign neoplasm of connective and other soft tissue, unspecified: Secondary | ICD-10-CM

## 2020-01-28 DIAGNOSIS — R1032 Left lower quadrant pain: Secondary | ICD-10-CM

## 2020-01-28 NOTE — Progress Notes (Signed)
Spoke w/ via phone for pre-op interview---pt with pacific interpreters number [phone] Lab needs dos----     uirne preg, type and screen, ekg I stat 8           Lab results------echo  01-16-2017 lov dr [place] 01-16-2017 for mild murmur COVID test ------01-29-2020 910 am Arrive at -------530 am 02-02-2020 NPO after MN NO Solid Food.  Medications to take morning of surgery -----amlodipine Diabetic medication -----n/a Patient Special Instructions -----none Pre-Op special Istructions -----none Patient verbalized understanding of instructions that were given at this phone interview. Patient denies shortness of breath, chest pain, fever, cough at this phone interview.  Spanish interpreter requested for 02-02-2020 surgery, copy of email on chart

## 2020-01-28 NOTE — Progress Notes (Signed)
   Tiffany Ali 09/16/1975 [id number]  SUBJECTIVE:  44 y.o. G80P0003 female presents for preoperative discussion of upcoming laparoscopic bilateral ovarian cystectomy for a large left dermoid cyst, recent ultrasound indicating this measured 7.4 x 6.7 cm.  She also had a small right ovarian cyst which may have involuted by this point.  She intermittently has pain and pressure symptoms in the left pelvis.  Professional Spanish interpreter present today.  Current Outpatient Medications  Medication Sig Dispense Refill  . acetaminophen (TYLENOL) 500 MG tablet Take 2 tablets (1,000 mg total) by mouth every 8 (eight) hours as needed. 90 tablet 0  . amLODipine (NORVASC) 2.5 MG tablet Take 1 tablet by mouth daily.    Marland Kitchen losartan (COZAAR) 100 MG tablet Take 1 tablet (100 mg total) by mouth daily. 90 tablet 1  . naproxen (NAPROSYN) 500 MG tablet Take 1 tablet (500 mg total) by mouth 2 (two) times daily with a meal. 60 tablet 0   No current facility-administered medications for this visit.   Allergies: Patient has no known allergies.  No LMP recorded.  Past medical history,surgical history, problem list, medications, allergies, family history and social history were all reviewed and documented as reviewed in the EPIC chart.   OBJECTIVE:  BP 122/80 (BP Location: Right Arm, Patient Position: Sitting, Cuff Size: Normal)  The patient appears well, alert, oriented x 3, in no distress. Abdomen: Lower abdominal transverse scar from previous abdominoplasty PELVIC EXAM: Bimanual exam performed, CERVIX: No CMT, UTERUS: Fibroid uterus mildly enlarged 8 to 10 weeks size, nontender, ADNEXA: 7-8 cm mass in left pelvis mildly tender to deeper palpation, feels mobile.  No palpable left adnexal mass or tenderness.  Chaperone: Aurora Mask (DNP student) present during the examination as chaperone  ASSESSMENT:  44 y.o. [phone] here for preoperative discussion of upcoming laparoscopic bilateral ovarian cystectomy with  pelvic washings for a left dermoid cyst  PLAN:  She had a chance to review her informational readings and all of her questions were answered to her satisfaction today. Again reviewed the risks, benefits, alternatives of surgery as outlined in the previous encounter from 12/25/2019.  Postoperative recovery limitations expectations reviewed.  She is ready to proceed as planned.   Joseph Pierini MD 01/28/20

## 2020-01-29 ENCOUNTER — Other Ambulatory Visit (HOSPITAL_COMMUNITY)
Admission: RE | Admit: 2020-01-29 | Discharge: 2020-01-29 | Disposition: A | Discharge status: Home / Self Care | Payer: BC Managed Care – PPO | Source: Ambulatory Visit | Attending: Obstetrics and Gynecology | Admitting: Obstetrics and Gynecology

## 2020-01-29 DIAGNOSIS — Z01812 Encounter for preprocedural laboratory examination: Secondary | ICD-10-CM | POA: Diagnosis not present

## 2020-01-29 DIAGNOSIS — U071 COVID-19: Secondary | ICD-10-CM | POA: Diagnosis not present

## 2020-01-29 LAB — SARS CORONAVIRUS 2 (TAT 6-24 HRS): SARS Coronavirus 2: POSITIVE — AB

## 2020-01-30 ENCOUNTER — Other Ambulatory Visit (HOSPITAL_COMMUNITY): Payer: Self-pay | Admitting: Nurse Practitioner

## 2020-01-30 ENCOUNTER — Telehealth (HOSPITAL_COMMUNITY): Payer: Self-pay | Admitting: Nurse Practitioner

## 2020-01-30 ENCOUNTER — Telehealth: Payer: Self-pay

## 2020-01-30 DIAGNOSIS — U071 COVID-19: Secondary | ICD-10-CM

## 2020-01-30 NOTE — Progress Notes (Signed)
I connected by phone with Tiffany Ali on 01/30/2020 at 9:31 AM to discuss the potential use of a new treatment for mild to moderate COVID-19 viral infection in non-hospitalized patients.  This patient is a 44 y.o. female that meets the FDA criteria for Emergency Use Authorization of COVID monoclonal antibody casirivimab/imdevimab.  Has a (+) direct SARS-CoV-2 viral test result  Has mild or moderate COVID-19   Is NOT hospitalized due to COVID-19  Is within 10 days of symptom onset 01/29/20  Has at least one of the high risk factor(s) for progression to severe COVID-19 and/or hospitalization as defined in EUA.  Specific high risk criteria : BMI > 25 and Other high risk medical condition per CDC:  Hispanic   I have spoken and communicated the following to the patient or parent/caregiver regarding COVID monoclonal antibody treatment:  1. FDA has authorized the emergency use for the treatment of mild to moderate COVID-19 in adults and pediatric patients with positive results of direct SARS-CoV-2 viral testing who are 61 years of age and older weighing at least 40 kg, and who are at high risk for progressing to severe COVID-19 and/or hospitalization.  2. The significant known and potential risks and benefits of COVID monoclonal antibody, and the extent to which such potential risks and benefits are unknown.  3. Information on available alternative treatments and the risks and benefits of those alternatives, including clinical trials.  4. Patients treated with COVID monoclonal antibody should continue to self-isolate and use infection control measures (e.g., wear mask, isolate, social distance, avoid sharing personal items, clean and disinfect "high touch" surfaces, and frequent handwashing) according to CDC guidelines.   5. The patient or parent/caregiver has the option to accept or refuse COVID monoclonal antibody treatment.  After reviewing this information with the patient, The patient  agreed to proceed with receiving casirivimab\imdevimab infusion and will be provided a copy of the Fact sheet prior to receiving the infusion. [name] 01/30/2020 9:31 AM

## 2020-01-30 NOTE — Telephone Encounter (Signed)
Tiffany Ali called and spoke with the patient. Patient had already received a call from [facility] about her positive Covid test and instructions/recommendations.  We rescheduled her surgery until 03/01/20 assuming she is asymptomatic at that time. I confirmed with [facility] that no pre op Covid test will be needed if surgery done 03/01/20. New date will be mailed to patient.

## 2020-01-30 NOTE — Telephone Encounter (Signed)
Called to discuss with Tiffany Ali with use of Interpretor about Covid symptoms and the use of casirivimab/imdevimab, a combination monoclonal antibody infusion for those with mild to moderate Covid symptoms and at a high risk of hospitalization.     Pt is qualified for this infusion at the [facility] infusion center due to co-morbid conditions and/or a member of an at-risk group, (bmi >25, Hispanic). Patient reports symptom onset of flu-like symptoms starting 01/29/20.   She verbalized understanding of infusion and has requested schedule 01/31/20 @ 1030 am.   Alda Lea, AGPCNP-BC

## 2020-01-30 NOTE — Progress Notes (Signed)
Message left for [facility] at Dr. Scarlette Ar office about covid test results.

## 2020-01-30 NOTE — Progress Notes (Signed)
Received call from front desk @[facility]  stated this pt had positive covid results. Called and left message for [place] , [place] scheduler for Dr Delilah Shan, about pt positive result and needs to be cancelled.  Also, asked if Dr Delilah Shan next pt that day would be moved to 0730.  If so, we well call pt and give new instructions.

## 2020-01-31 ENCOUNTER — Ambulatory Visit (HOSPITAL_COMMUNITY)
Admission: RE | Admit: 2020-01-31 | Discharge: 2020-01-31 | Disposition: A | Discharge status: Home / Self Care | Payer: BC Managed Care – PPO | Source: Ambulatory Visit | Attending: Pulmonary Disease | Admitting: Pulmonary Disease

## 2020-01-31 DIAGNOSIS — U071 COVID-19: Secondary | ICD-10-CM | POA: Diagnosis present

## 2020-01-31 DIAGNOSIS — Z6825 Body mass index (BMI) 25.0-25.9, adult: Secondary | ICD-10-CM | POA: Diagnosis not present

## 2020-01-31 MED ORDER — EPINEPHRINE 0.3 MG/0.3ML IJ SOAJ: 0.3000 mg | Freq: Once | INTRAMUSCULAR | Status: DC | PRN

## 2020-01-31 MED ORDER — SODIUM CHLORIDE 0.9 % IV SOLN
1200.0000 mg | Freq: Once | INTRAVENOUS | Status: AC
  Administered 2020-01-31: 1200 mg via INTRAVENOUS
  Filled 2020-01-31: qty 10

## 2020-01-31 MED ORDER — METHYLPREDNISOLONE SODIUM SUCC 125 MG IJ SOLR: 125.0000 mg | Freq: Once | INTRAMUSCULAR | Status: DC | PRN

## 2020-01-31 MED ORDER — DIPHENHYDRAMINE HCL 50 MG/ML IJ SOLN: 50.0000 mg | Freq: Once | INTRAMUSCULAR | Status: DC | PRN

## 2020-01-31 MED ORDER — FAMOTIDINE IN NACL 20-0.9 MG/50ML-% IV SOLN: 20.0000 mg | Freq: Once | INTRAVENOUS | Status: DC | PRN

## 2020-01-31 MED ORDER — ALBUTEROL SULFATE HFA 108 (90 BASE) MCG/ACT IN AERS: 2.0000 | INHALATION_SPRAY | Freq: Once | RESPIRATORY_TRACT | Status: DC | PRN

## 2020-01-31 MED ORDER — SODIUM CHLORIDE 0.9 % IV SOLN: INTRAVENOUS | Status: DC | PRN

## 2020-01-31 NOTE — Progress Notes (Signed)
°  Diagnosis: COVID-19  Physician: Dr. Joya Gaskins  Procedure: Covid Infusion Clinic Med: casirivimab\imdevimab infusion - Provided patient with casirivimab\imdevimab fact sheet for patients, parents and caregivers prior to infusion.  Complications: No immediate complications noted.  Discharge: Discharged home   Cheri Guppy 01/31/2020

## 2020-01-31 NOTE — Discharge Instructions (Signed)
What types of side effects do monoclonal antibody drugs cause?  Common side effects  In general, the more common side effects caused by monoclonal antibody drugs include: . Allergic reactions, such as hives or itching . Flu-like signs and symptoms, including chills, fatigue, fever, and muscle aches and pains . Nausea, vomiting . Diarrhea . Skin rashes . Low blood pressure   The CDC is recommending patients who receive monoclonal antibody treatments wait at least 90 days before being vaccinated.  Currently, there are no data on the safety and efficacy of mRNA COVID-19 vaccines in persons who received monoclonal antibodies or convalescent plasma as part of COVID-19 treatment. Based on the estimated half-life of such therapies as well as evidence suggesting that reinfection is uncommon in the 90 days after initial infection, vaccination should be deferred for at least 90 days, as a precautionary measure until additional information becomes available, to avoid interference of the antibody treatment with vaccine-induced immune responses.    COVID-19: Cmo protegerse y proteger a los dems COVID-19: How to Protect Yourself and Others Sepa cmo se propaga  Actualmente, no existe ninguna vacuna para prevenir la enfermedad por coronavirus 2019 (COVID-19).  La mejor forma de prevenir la enfermedad es evitar exponerse a este virus.  Se cree que el virus se transmite principalmente de [place] persona a [place]. ? [employer] que estn en contacto directo entre s (a una distancia inferior a 6 pies [1.45m]). ? A travs de las gotitas respiratorias producidas cuando una persona infectada tose, estornuda o habla. ? Estas gotitas pueden caer en la boca o en la nariz de las personas que estn cerca o pueden ser [employer] pulmones. ? El COVID-19 puede ser transmitido por personas que no presentan sntomas. Lo que todos deben hacer [employer] las manos con frecuencia  Lvese las manos con  frecuencia con agua y jabn durante al menos 20 segundos, especialmente despus de Investment banker, corporate en un lugar pblico o despus de sonarse la nariz, toser o estornudar.  Si no dispone de [place] y [place], use un desinfectante de manos que contenga al menos un 60% de alcohol. [place] y frtelas hasta que se sientan secas.  No se toque los ojos, la nariz y la boca sin antes lavarse las manos. Evite el contacto cercano  Limite el contacto directo con otras personas tanto como sea posible.  Evite el contacto cercano con personas que estn enfermas.  Establezca distancia entre usted y [employer]. ? Recuerde que [employer] Products no tienen sntomas pueden transmitir el virus. ? Esto es Scientist, research (life sciences) para las personas que tienen ms riesgo de [email or web address].fr Cbrase la boca y la nariz con una mascarilla cuando est cerca de otras personas  Puede transmitir el COVID-19 a otras personas aunque no se sienta enfermo.  Todas las Proofreader en lugares pblicos y cuando estn con otras que no vivan en su casa, especialmente cuando el distanciamiento social sea difcil de [place]. ? Las mascarillas no deben colocarse a nios menores de 2 aos de [place], a las personas que tienen problemas respiratorios o que estn inconscientes, incapacitadas o que por algn motivo no puedan quitarse la mascarilla sin [place].  El propsito de Quarry manager es proteger a Producer, television/film/video en caso de que usted est infectado.  NO utilice las Aleeta [place] [employer] a los trabajadores de [employer]	[place].  Contine manteniendo una distancia aproximada de 6 pies (1.53m) entre usted y [employer]. La mascarilla no  reemplaza el distanciamiento social. Rachel Moulds al toser y estornudar  Al toser o al estornudar, siempre cbrase la boca y la nariz con un pauelo descartable o use el interior del  codo.  Deseche los pauelos descartables usados en la basura.  Inmediatamente, lvese las manos con agua y [place] durante al menos 20segundos. Si no dispone de agua y [place], [employer] con un desinfectante de manos que contenga al menos un 60% de alcohol. Limpie y desinfecte  Limpie Y desinfecte las superficies que se tocan con frecuencia todos los das. Esto incluye mesas, picaportes, interruptores de luz, encimeras, mangos, escritorios, telfonos, teclados, inodoros, grifos y lavabos. [email or web address]  Si las superficies estn sucias, lmpielas: Use detergente o jabn y agua antes de la desinfeccin.  Luego, use un desinfectante domstico. Puede consultar una lista de los desinfectantes domsticos registrados en Conservation officer, historic buildings (EPA) (Agencia de Proteccin Ambiental) aqu. [email or web address] 01/22/2019 Esta informacin no tiene Marine scientist el consejo del mdico. Asegrese de hacerle al mdico cualquier pregunta que tenga. Document Revised: 02/05/2019 Document Reviewed: 11/29/2018 Elsevier Patient Education  [place].

## 2020-02-24 ENCOUNTER — Encounter (HOSPITAL_BASED_OUTPATIENT_CLINIC_OR_DEPARTMENT_OTHER): Payer: Self-pay | Admitting: Obstetrics and Gynecology

## 2020-02-24 ENCOUNTER — Other Ambulatory Visit: Payer: Self-pay

## 2020-02-24 NOTE — Progress Notes (Signed)
Spoke w/ via phone for pre-op interview---pt  With pacific interpreters erin [phone] number Lab needs dos----urine preg t and s, ekg I stat 8               Lab results------echo 01-16-2017 lov dr [place] 01-16-2017 for mild murmur COVID test -----not needed covid positive 01-29-2020- Arrive at -------730 am 03-01-2020 NPO after MN NO Solid Food.   Medications to take morning of surgery -----amlodipine Diabetic medication -----n/a Patient Special Instructions -----none Pre-Op special Istructions -----none Patient verbalized understanding of instructions that were given at this phone interview. Patient denies shortness of breath, chest pain, fever, cough at this phone interview.  Spanish interpreter requested for day of surgery email on chart

## 2020-03-01 ENCOUNTER — Ambulatory Visit (HOSPITAL_BASED_OUTPATIENT_CLINIC_OR_DEPARTMENT_OTHER): Payer: BC Managed Care – PPO | Admitting: Anesthesiology

## 2020-03-01 ENCOUNTER — Encounter (HOSPITAL_BASED_OUTPATIENT_CLINIC_OR_DEPARTMENT_OTHER)
Admission: RE | Disposition: A | Discharge status: Home / Self Care | Payer: Self-pay | Source: Ambulatory Visit | Attending: Obstetrics and Gynecology

## 2020-03-01 ENCOUNTER — Encounter (HOSPITAL_BASED_OUTPATIENT_CLINIC_OR_DEPARTMENT_OTHER): Payer: Self-pay | Admitting: Obstetrics and Gynecology

## 2020-03-01 ENCOUNTER — Other Ambulatory Visit: Payer: Self-pay

## 2020-03-01 ENCOUNTER — Ambulatory Visit (HOSPITAL_BASED_OUTPATIENT_CLINIC_OR_DEPARTMENT_OTHER)
Admission: RE | Admit: 2020-03-01 | Discharge: 2020-03-01 | Disposition: A | Discharge status: Home / Self Care | Payer: BC Managed Care – PPO | Source: Ambulatory Visit | Attending: Obstetrics and Gynecology | Admitting: Obstetrics and Gynecology

## 2020-03-01 DIAGNOSIS — K660 Peritoneal adhesions (postprocedural) (postinfection): Secondary | ICD-10-CM | POA: Diagnosis not present

## 2020-03-01 DIAGNOSIS — D271 Benign neoplasm of left ovary: Secondary | ICD-10-CM

## 2020-03-01 DIAGNOSIS — N83291 Other ovarian cyst, right side: Secondary | ICD-10-CM | POA: Diagnosis present

## 2020-03-01 DIAGNOSIS — S6991XA Unspecified injury of right wrist, hand and finger(s), initial encounter: Secondary | ICD-10-CM

## 2020-03-01 DIAGNOSIS — I10 Essential (primary) hypertension: Secondary | ICD-10-CM | POA: Insufficient documentation

## 2020-03-01 DIAGNOSIS — D199 Benign neoplasm of mesothelial tissue, unspecified: Secondary | ICD-10-CM

## 2020-03-01 HISTORY — DX: Cardiac murmur, unspecified: R01.1

## 2020-03-01 HISTORY — DX: Other complications of anesthesia, initial encounter: T88.59XA

## 2020-03-01 HISTORY — PX: LAPAROSCOPIC OVARIAN CYSTECTOMY: SHX6248

## 2020-03-01 HISTORY — PX: LAPAROSCOPIC LYSIS OF ADHESIONS: SHX5905

## 2020-03-01 HISTORY — DX: Left lower quadrant pain: R10.32

## 2020-03-01 HISTORY — DX: COVID-19: U07.1

## 2020-03-01 LAB — POCT I-STAT, CHEM 8
BUN: 14 mg/dL (ref 6–20)
Calcium, Ion: 1.19 mmol/L (ref 1.15–1.40)
Chloride: 103 mmol/L (ref 98–111)
Creatinine, Ser: 0.6 mg/dL (ref 0.44–1.00)
Glucose, Bld: 101 mg/dL — ABNORMAL HIGH (ref 70–99)
HCT: 37 % (ref 36.0–46.0)
Hemoglobin: 12.6 g/dL (ref 12.0–15.0)
Potassium: 3.6 mmol/L (ref 3.5–5.1)
Sodium: 140 mmol/L (ref 135–145)
TCO2: 25 mmol/L (ref 22–32)

## 2020-03-01 LAB — POCT PREGNANCY, URINE: Preg Test, Ur: NEGATIVE

## 2020-03-01 SURGERY — EXCISION, CYST, OVARY, LAPAROSCOPIC
Anesthesia: General | Site: Abdomen

## 2020-03-01 MED ORDER — MEPERIDINE HCL 25 MG/ML IJ SOLN: 6.2500 mg | INTRAMUSCULAR | Status: DC | PRN

## 2020-03-01 MED ORDER — OXYCODONE HCL 5 MG PO TABS: 5.0000 mg | ORAL_TABLET | Freq: Four times a day (QID) | ORAL | 0 refills | Status: DC | PRN

## 2020-03-01 MED ORDER — KETOROLAC TROMETHAMINE 15 MG/ML IJ SOLN: 15.0000 mg | Freq: Once | INTRAMUSCULAR | Status: DC

## 2020-03-01 MED ORDER — PHENYLEPHRINE 40 MCG/ML (10ML) SYRINGE FOR IV PUSH (FOR BLOOD PRESSURE SUPPORT)
PREFILLED_SYRINGE | INTRAVENOUS | Status: AC
  Filled 2020-03-01: qty 10

## 2020-03-01 MED ORDER — LACTATED RINGERS IV SOLN: INTRAVENOUS | Status: DC

## 2020-03-01 MED ORDER — SCOPOLAMINE 1 MG/3DAYS TD PT72
MEDICATED_PATCH | TRANSDERMAL | Status: AC
  Filled 2020-03-01: qty 1

## 2020-03-01 MED ORDER — ONDANSETRON HCL 4 MG/2ML IJ SOLN
INTRAMUSCULAR | Status: DC | PRN
  Administered 2020-03-01: 4 mg via INTRAVENOUS

## 2020-03-01 MED ORDER — ONDANSETRON HCL 4 MG/2ML IJ SOLN
INTRAMUSCULAR | Status: AC
  Filled 2020-03-01: qty 2

## 2020-03-01 MED ORDER — SODIUM CHLORIDE 0.9% FLUSH: 3.0000 mL | Freq: Two times a day (BID) | INTRAVENOUS | Status: DC

## 2020-03-01 MED ORDER — KETOROLAC TROMETHAMINE 30 MG/ML IJ SOLN
INTRAMUSCULAR | Status: AC
  Filled 2020-03-01: qty 1

## 2020-03-01 MED ORDER — MIDAZOLAM HCL 2 MG/2ML IJ SOLN
INTRAMUSCULAR | Status: DC | PRN
  Administered 2020-03-01: 2 mg via INTRAVENOUS

## 2020-03-01 MED ORDER — SUGAMMADEX SODIUM 200 MG/2ML IV SOLN
INTRAVENOUS | Status: DC | PRN
  Administered 2020-03-01: 200 mg via INTRAVENOUS

## 2020-03-01 MED ORDER — FENTANYL CITRATE (PF) 100 MCG/2ML IJ SOLN
INTRAMUSCULAR | Status: DC | PRN
  Administered 2020-03-01: 100 ug via INTRAVENOUS
  Administered 2020-03-01: 50 ug via INTRAVENOUS

## 2020-03-01 MED ORDER — ACETAMINOPHEN 10 MG/ML IV SOLN
INTRAVENOUS | Status: AC
  Filled 2020-03-01: qty 100

## 2020-03-01 MED ORDER — OXYCODONE HCL 5 MG PO TABS: 5.0000 mg | ORAL_TABLET | Freq: Once | ORAL | Status: DC | PRN

## 2020-03-01 MED ORDER — PROPOFOL 10 MG/ML IV BOLUS
INTRAVENOUS | Status: DC | PRN
  Administered 2020-03-01: 150 mg via INTRAVENOUS

## 2020-03-01 MED ORDER — DEXAMETHASONE SODIUM PHOSPHATE 10 MG/ML IJ SOLN
INTRAMUSCULAR | Status: DC | PRN
  Administered 2020-03-01: 10 mg via INTRAVENOUS

## 2020-03-01 MED ORDER — ACETAMINOPHEN 325 MG PO TABS: 325.0000 mg | ORAL_TABLET | ORAL | Status: DC | PRN

## 2020-03-01 MED ORDER — ACETAMINOPHEN 500 MG PO TABS: 1000.0000 mg | ORAL_TABLET | Freq: Four times a day (QID) | ORAL | Status: DC

## 2020-03-01 MED ORDER — ROCURONIUM BROMIDE 10 MG/ML (PF) SYRINGE
PREFILLED_SYRINGE | INTRAVENOUS | Status: DC | PRN
  Administered 2020-03-01: 70 mg via INTRAVENOUS
  Administered 2020-03-01: 10 mg via INTRAVENOUS
  Administered 2020-03-01: 20 mg via INTRAVENOUS

## 2020-03-01 MED ORDER — ACETAMINOPHEN 500 MG PO TABS: 1000.0000 mg | ORAL_TABLET | Freq: Four times a day (QID) | ORAL | Status: AC

## 2020-03-01 MED ORDER — BUPIVACAINE HCL (PF) 0.25 % IJ SOLN
INTRAMUSCULAR | Status: DC | PRN
  Administered 2020-03-01: 15 mL

## 2020-03-01 MED ORDER — ACETAMINOPHEN 160 MG/5ML PO SOLN: 325.0000 mg | ORAL | Status: DC | PRN

## 2020-03-01 MED ORDER — FENTANYL CITRATE (PF) 100 MCG/2ML IJ SOLN
INTRAMUSCULAR | Status: AC
  Filled 2020-03-01: qty 2

## 2020-03-01 MED ORDER — PROPOFOL 10 MG/ML IV BOLUS
INTRAVENOUS | Status: AC
  Filled 2020-03-01: qty 40

## 2020-03-01 MED ORDER — OXYCODONE HCL 5 MG/5ML PO SOLN: 5.0000 mg | Freq: Once | ORAL | Status: DC | PRN

## 2020-03-01 MED ORDER — SCOPOLAMINE 1 MG/3DAYS TD PT72
MEDICATED_PATCH | TRANSDERMAL | Status: DC | PRN
  Administered 2020-03-01: 1 via TRANSDERMAL

## 2020-03-01 MED ORDER — LIDOCAINE 2% (20 MG/ML) 5 ML SYRINGE
INTRAMUSCULAR | Status: AC
  Filled 2020-03-01: qty 10

## 2020-03-01 MED ORDER — SODIUM CHLORIDE 0.9% FLUSH: 3.0000 mL | INTRAVENOUS | Status: DC | PRN

## 2020-03-01 MED ORDER — SODIUM CHLORIDE 0.9 % IR SOLN
Status: DC | PRN
  Administered 2020-03-01: 3000 mL

## 2020-03-01 MED ORDER — EPHEDRINE 5 MG/ML INJ
INTRAVENOUS | Status: AC
  Filled 2020-03-01: qty 10

## 2020-03-01 MED ORDER — MIDAZOLAM HCL 2 MG/2ML IJ SOLN
INTRAMUSCULAR | Status: AC
  Filled 2020-03-01: qty 2

## 2020-03-01 MED ORDER — ROCURONIUM BROMIDE 10 MG/ML (PF) SYRINGE
PREFILLED_SYRINGE | INTRAVENOUS | Status: AC
  Filled 2020-03-01: qty 10

## 2020-03-01 MED ORDER — ONDANSETRON HCL 4 MG/2ML IJ SOLN: 4.0000 mg | Freq: Once | INTRAMUSCULAR | Status: DC | PRN

## 2020-03-01 MED ORDER — IBUPROFEN 600 MG PO TABS: 600.0000 mg | ORAL_TABLET | Freq: Four times a day (QID) | ORAL | 1 refills | Status: DC

## 2020-03-01 MED ORDER — LIDOCAINE 2% (20 MG/ML) 5 ML SYRINGE
INTRAMUSCULAR | Status: DC | PRN
  Administered 2020-03-01: 1.5 mg/kg/h via INTRAVENOUS

## 2020-03-01 MED ORDER — ACETAMINOPHEN 325 MG PO TABS: 650.0000 mg | ORAL_TABLET | ORAL | Status: DC | PRN

## 2020-03-01 MED ORDER — PHENYLEPHRINE 40 MCG/ML (10ML) SYRINGE FOR IV PUSH (FOR BLOOD PRESSURE SUPPORT)
PREFILLED_SYRINGE | INTRAVENOUS | Status: DC | PRN
  Administered 2020-03-01: 80 ug via INTRAVENOUS
  Administered 2020-03-01: 120 ug via INTRAVENOUS
  Administered 2020-03-01: 80 ug via INTRAVENOUS

## 2020-03-01 MED ORDER — ACETAMINOPHEN 10 MG/ML IV SOLN
INTRAVENOUS | Status: DC | PRN
  Administered 2020-03-01: 1000 mg via INTRAVENOUS

## 2020-03-01 MED ORDER — LIDOCAINE 2% (20 MG/ML) 5 ML SYRINGE
INTRAMUSCULAR | Status: DC | PRN
  Administered 2020-03-01: 100 mg via INTRAVENOUS

## 2020-03-01 MED ORDER — KETOROLAC TROMETHAMINE 30 MG/ML IJ SOLN
INTRAMUSCULAR | Status: DC | PRN
  Administered 2020-03-01: 30 mg via INTRAVENOUS

## 2020-03-01 MED ORDER — SODIUM CHLORIDE 0.9 % IV SOLN: 250.0000 mL | INTRAVENOUS | Status: DC | PRN

## 2020-03-01 MED ORDER — FENTANYL CITRATE (PF) 100 MCG/2ML IJ SOLN
25.0000 ug | INTRAMUSCULAR | Status: DC | PRN
  Administered 2020-03-01 (×2): 25 ug via INTRAVENOUS

## 2020-03-01 MED ORDER — BUPIVACAINE HCL (PF) 0.25 % IJ SOLN: 10.0000 mL | Freq: Once | INTRAMUSCULAR | Status: DC

## 2020-03-01 MED ORDER — DOCUSATE SODIUM 100 MG PO CAPS: 100.0000 mg | ORAL_CAPSULE | Freq: Every day | ORAL | 1 refills | Status: AC | PRN

## 2020-03-01 MED ORDER — ACETAMINOPHEN 325 MG RE SUPP: 650.0000 mg | RECTAL | Status: DC | PRN

## 2020-03-01 MED ORDER — DEXAMETHASONE SODIUM PHOSPHATE 10 MG/ML IJ SOLN
INTRAMUSCULAR | Status: AC
  Filled 2020-03-01: qty 1

## 2020-03-01 MED ORDER — LIDOCAINE 2% (20 MG/ML) 5 ML SYRINGE
INTRAMUSCULAR | Status: AC
  Filled 2020-03-01: qty 5

## 2020-03-01 MED ORDER — OXYCODONE HCL 5 MG PO TABS: 5.0000 mg | ORAL_TABLET | ORAL | Status: DC | PRN

## 2020-03-01 SURGICAL SUPPLY — 47 items
APPLICATOR ARISTA FLEXITIP XL (MISCELLANEOUS) IMPLANT
APPLICATOR COTTON TIP 6 STRL (MISCELLANEOUS) ×3 IMPLANT
APPLICATOR COTTON TIP 6IN STRL (MISCELLANEOUS) ×4
BAG RETRIEVAL 10 (BASKET)
BARRIER ADHS 3X4 INTERCEED (GAUZE/BANDAGES/DRESSINGS) IMPLANT
CABLE HIGH FREQUENCY MONO STRZ (ELECTRODE) ×4 IMPLANT
CATH ROBINSON RED A/P 14FR (CATHETERS) ×4 IMPLANT
CATH ROBINSON RED A/P 16FR (CATHETERS) IMPLANT
COVER MAYO STAND STRL (DRAPES) ×4 IMPLANT
COVER WAND RF STERILE (DRAPES) ×4 IMPLANT
DEFOGGER SCOPE WARMER CLEARIFY (MISCELLANEOUS) ×4 IMPLANT
DERMABOND ADVANCED (GAUZE/BANDAGES/DRESSINGS) ×1
DERMABOND ADVANCED .7 DNX12 (GAUZE/BANDAGES/DRESSINGS) ×3 IMPLANT
DURAPREP 26ML APPLICATOR (WOUND CARE) ×4 IMPLANT
GAUZE 4X4 16PLY RFD (DISPOSABLE) ×4 IMPLANT
GLOVE BIO SURGEON STRL SZ8 (GLOVE) ×8 IMPLANT
GLOVE BIOGEL PI IND STRL 7.0 (GLOVE) ×6 IMPLANT
GLOVE BIOGEL PI IND STRL 8 (GLOVE) ×6 IMPLANT
GLOVE BIOGEL PI INDICATOR 7.0 (GLOVE) ×2
GLOVE BIOGEL PI INDICATOR 8 (GLOVE) ×2
GOWN STRL REUS W/TWL LRG LVL3 (GOWN DISPOSABLE) ×4 IMPLANT
GOWN STRL REUS W/TWL XL LVL3 (GOWN DISPOSABLE) ×4 IMPLANT
HEMOSTAT ARISTA ABSORB 3G PWDR (HEMOSTASIS) IMPLANT
LIGASURE BLUNT TIP 5 LONG 44CM (ELECTROSURGICAL) IMPLANT
LIGASURE LAP L-HOOKWIRE 5 44CM (INSTRUMENTS) IMPLANT
LIGASURE VESSEL 5MM BLUNT TIP (ELECTROSURGICAL) ×4 IMPLANT
PACK LAPAROSCOPY BASIN (CUSTOM PROCEDURE TRAY) ×4 IMPLANT
PACK TRENDGUARD 450 HYBRID PRO (MISCELLANEOUS) ×3 IMPLANT
PAD OB MATERNITY 4.3X12.25 (PERSONAL CARE ITEMS) ×4 IMPLANT
POUCH SPECIMEN RETRIEVAL 10MM (ENDOMECHANICALS) ×8 IMPLANT
PROTECTOR NERVE ULNAR (MISCELLANEOUS) ×8 IMPLANT
SCISSORS LAP 5X35 DISP (ENDOMECHANICALS) ×4 IMPLANT
SET SUCTION IRRIG HYDROSURG (IRRIGATION / IRRIGATOR) ×4 IMPLANT
SET TUBE SMOKE EVAC HIGH FLOW (TUBING) ×4 IMPLANT
SUT MNCRL AB 4-0 PS2 18 (SUTURE) ×8 IMPLANT
SUT MON AB 4-0 PC3 18 (SUTURE) ×8 IMPLANT
SUT VIC AB 2-0 UR6 27 (SUTURE) ×4 IMPLANT
SYS BAG RETRIEVAL 10MM (BASKET)
SYSTEM BAG RETRIEVAL 10MM (BASKET) IMPLANT
TOWEL OR 17X26 10 PK STRL BLUE (TOWEL DISPOSABLE) ×4 IMPLANT
TRAP SPECIMEN MUCUS 40CC (MISCELLANEOUS) ×4 IMPLANT
TRAY FOLEY W/BAG SLVR 14FR LF (SET/KITS/TRAYS/PACK) ×4 IMPLANT
TRENDGUARD 450 HYBRID PRO PACK (MISCELLANEOUS) ×4
TROCAR BALLN 12MMX100 BLUNT (TROCAR) ×4 IMPLANT
TROCAR BLADELESS OPT 5 100 (ENDOMECHANICALS) ×8 IMPLANT
TROCAR XCEL NON-BLD 11X100MML (ENDOMECHANICALS) IMPLANT
WARMER LAPAROSCOPE (MISCELLANEOUS) ×4 IMPLANT

## 2020-03-01 NOTE — Transfer of Care (Signed)
Immediate Anesthesia Transfer of Care Note  Patient: Tiffany Ali  Procedure(s) Performed: Procedure(s) (LRB): LAPAROSCOPIC OVARIAN CYSTECTOMY WITH PELVIC WASHINGS (Bilateral) LAPAROSCOPIC LYSIS OF ADHESIONS (N/A)  Patient Location: PACU  Anesthesia Type: General  Level of Consciousness: drowsy  Airway & Oxygen Therapy: Patient Spontanous Breathing and Patient connected to nasal cannula oxygen  Post-op Assessment: Report given to PACU RN and Post -op Vital signs reviewed and stable  Post vital signs: Reviewed and stable  Complications: No apparent anesthesia complications  Last Vitals:  Vitals Value Taken Time  BP    Temp    Pulse    Resp    SpO2      Last Pain:  Vitals:   03/01/20 0742  TempSrc: Oral  PainSc: 0-No pain         Complications: No complications documented.

## 2020-03-01 NOTE — Brief Op Note (Signed)
03/01/2020  12:45 PM  PATIENT:  Tiffany Ali  44 y.o. female  PRE-OPERATIVE DIAGNOSIS:  left lower quadrant pain, left dermoid cyst, right simple ovarian cyst  POST-OPERATIVE DIAGNOSIS:  left lower quadrant pain, left dermoid cyst, right simple ovarian cyst, omental adhesions  PROCEDURE:  Procedure(s) with comments: LAPAROSCOPIC OVARIAN CYSTECTOMY WITH PELVIC WASHINGS (Bilateral) - request 7:30am OR time in [facility]  block requests 90 minutes LAPAROSCOPIC LYSIS OF ADHESIONS (N/A)  SURGEON:  Surgeon(s) and Role:    * Joseph Pierini, MD - Primary    * Princess Bruins, MD - Assisting  ANESTHESIA:   local and general  EBL:  50 mL   BLOOD ADMINISTERED:none  DRAINS: none   LOCAL MEDICATIONS USED:  BUPIVICAINE   SPECIMEN:  Left ovarian cyst, pelvic washings  DISPOSITION OF SPECIMEN:  PATHOLOGY  COUNTS:  YES  TOURNIQUET:  * No tourniquets in log *  DICTATION: .Note written in EPIC  PLAN OF CARE: Discharge to home after PACU  PATIENT DISPOSITION:  PACU - hemodynamically stable.   Delay start of Pharmacological VTE agent (>24hrs) due to surgical blood loss or risk of bleeding: not applicable

## 2020-03-01 NOTE — Anesthesia Preprocedure Evaluation (Signed)
Anesthesia Evaluation  Patient identified by MRN, date of birth, ID band Patient awake    Reviewed: Allergy & Precautions, NPO status , Patient's Chart, lab work & pertinent test results  Airway Mallampati: I       Dental no notable dental hx.    Pulmonary neg pulmonary ROS,    Pulmonary exam normal        Cardiovascular hypertension, Pt. on medications Normal cardiovascular exam     Neuro/Psych negative neurological ROS  negative psych ROS   GI/Hepatic negative GI ROS, Neg liver ROS,   Endo/Other  negative endocrine ROS  Renal/GU negative Renal ROS  negative genitourinary   Musculoskeletal negative musculoskeletal ROS (+)   Abdominal Normal abdominal exam  (+)   Peds  Hematology   Anesthesia Other Findings   Reproductive/Obstetrics                             Anesthesia Physical Anesthesia Plan  ASA: II  Anesthesia Plan: General   Post-op Pain Management:    Induction: Intravenous  PONV Risk Score and Plan: 4 or greater and Ondansetron, Dexamethasone and Midazolam  Airway Management Planned: Oral ETT  Additional Equipment: None  Intra-op Plan:   Post-operative Plan: Extubation in OR  Informed Consent: I have reviewed the patients History and Physical, chart, labs and discussed the procedure including the risks, benefits and alternatives for the proposed anesthesia with the patient or authorized representative who has indicated his/her understanding and acceptance.     Dental advisory given  Plan Discussed with: CRNA  Anesthesia Plan Comments:         Anesthesia Quick Evaluation

## 2020-03-01 NOTE — Discharge Instructions (Signed)
Ciruga de ovrica mnimamente invasiva, cuidados posteriores  Minimally Invasive Ovarian Surgery, Care After  Esta hoja le brinda informacin sobre cmo cuidarse despus del procedimiento. El mdico tambin podr darle instrucciones ms especficas. Comunquese con el mdico si tiene problemas o preguntas. Qu puedo esperar despus del procedimiento? Despus del procedimiento, es normal tener los siguientes sntomas:  Clicos leves en la zona inferior del abdomen.  Cansancio (fatiga). Es posible que se sienta cansada durante algunos das. Siga estas instrucciones en su casa: Cuidados de la incisin    Siga las instrucciones del mdico acerca del [place]. Asegrese de hacer lo siguiente: ? Lvese las manos con agua y [place] antes y despus de cambiar la venda (vendaje). Use desinfectante para manos si no dispone de [place] y [place]. ? Cambie el vendaje como se lo haya indicado el mdico. ? No retire los puntos (suturas), la goma para cerrar la piel o las tiras [place]. Es posible que estos cierres cutneos deban quedar puestos en la piel durante 2semanas o ms tiempo. Si los bordes de las tiras adhesivas empiezan a despegarse y Therapist, sports, puede recortar los que estn sueltos. No retire las tiras [facility] por completo a menos que el mdico se lo indique.  Controle todos los [employer] lugar de las incisiones para Hydrographic surveyor signos de infeccin. Preste atencin a los siguientes signos: ? Enrojecimiento, hinchazn o dolor que empeoran. ? Lquido o sangre. ? [place]. ? Pus o mal olor.  [place] y secas.  No tome baos de inmersin, no nade ni use el jacuzzi hasta que el mdico lo autorice. Pregntele al mdico si puede ducharse. Thurston Pounds solo le permitan darse baos de [place]. Actividad  Retome sus actividades normales segn lo indicado por el mdico. Pregntele al mdico qu actividades son seguras para usted. Pregunte sobre lo  siguiente: ? Cundo volver al Mat Carne y a Engineer, site. ? Cualquier restriccin de peso que [employer]els. ? Cundo puede [employer] sexual.  Sherilyn Cooter reposo como se lo haya indicado el mdico.  Evite estar sentada durante largos perodos sin moverse. Levntese y camine un poco cada 1 a 2 horas. Esto es importante para mejorar el flujo sanguneo y la respiracin. Pida ayuda si se siente dbil o inestable. Instrucciones generales  Use los medicamentos de venta libre y los recetados solamente como se lo haya indicado el mdico.  Pregntele al mdico si el medicamento recetado: ? Hace necesario que evite conducir o usar maquinaria pesada. ? Puede causarle estreimiento. Es posible que tenga que tomar estas medidas para prevenir o tratar el estreimiento:  Electronics engineer suficiente lquido como para Theatre manager la orina de color amarillo plido.  Usar medicamentos recetados o de Radio broadcast assistant.  Consumir alimentos ricos en fibra, como frijoles, cereales integrales, y frutas y verduras frescas.  Limitar su consumo de alimentos ricos en grasa y azcares procesados, como los alimentos fritos o dulces.  Concurra a todas las visitas de seguimiento como se lo haya indicado el mdico. Esto es importante. Comunquese con un mdico si:  Tiene sangrado o secrecin inusual de la vagina.  Tiene un dolor abdominal que empeora o que no mejora con los medicamentos.  Siente nuseas.  Tiene enrojecimiento, hinchazn o dolor alrededor de cualquiera de las incisiones.  Le sale lquido o sangre de cualquiera de las incisiones.  Cualquiera de las incisiones est caliente al tacto.  Le sale pus o percibe mal olor de cualquiera de las incisiones. Solicite ayuda inmediatamente si:  Tiene fiebre.  Tiene abundante sangrado o secrecin inusual de la vagina.  Siente un dolor intenso en el abdomen.  No puede dejar de vomitar.  Tiene nuseas que no se alivian.  Se desmaya.  No puede vaciar la  vejiga. Resumen  Despus del procedimiento, es comn tener clicos leves y sentirse cansada durante algunos das.  Retome sus actividades normales segn lo indicado por el mdico. Pregntele al mdico qu actividades son seguras para usted.  Comunquese con un mdico si tiene complicaciones, como sangrado o secrecin inusual, nuseas, dolor que [place] o lquido o sangre provenientes de cualquiera de las incisiones.  Concurra a todas las visitas de seguimiento como se lo haya indicado el mdico. Esta informacin no tiene Marine scientist el consejo del mdico. Asegrese de hacerle al mdico cualquier pregunta que tenga. Document Revised: 08/27/2018 Document Reviewed: 08/27/2018 Elsevier Patient Education  [place].

## 2020-03-01 NOTE — H&P (Signed)
   Tiffany Ali 10/10/1975 MRN: [id number]  HPI The patient is a 44 y.o. [id number] who presents today for scheduled laparoscopic bilateral cystectomy with excision of a large left dermoid cyst.  No changes to her medical history since her pre op exam, denies CP, SOB, fever/chills, dysuria.  She had a positive preoperative COVID-19 screening test the other week which necessitated rescheduling her surgery to today.  She tells me she did have mild cold symptoms only and has fully recovered and feels well. A Spanish interpreter is present for the visit.  Past Medical History:  Diagnosis Date  . Complication of anesthesia    "woke up irriated after c section"  . COVID 9-01-22-2020   flu like sympronsms x 2 weeks all symptoms resolved  . Heart murmur    mild murmur worked up by dr [place] 01-16-2017 follow up prn  . Hypertension   . Left lower quadrant pain    Past Surgical History:  Procedure Laterality Date  . ABDOMINOPLASTY  14 yrs ago   states no mesh   . CESAREAN SECTION  2001, 2004   x 2   No current facility-administered medications on file prior to encounter.   Current Outpatient Medications on File Prior to Encounter  Medication Sig Dispense Refill  . acetaminophen (TYLENOL) 500 MG tablet Take 2 tablets (1,000 mg total) by mouth every 8 (eight) hours as needed. 90 tablet 0  . amLODipine (NORVASC) 2.5 MG tablet Take 1 tablet by mouth daily.    Marland Kitchen losartan (COZAAR) 100 MG tablet Take 1 tablet (100 mg total) by mouth daily. 90 tablet 1   No Known Allergies    Physical Exam   BP (!) 171/95   Pulse (!) 109   Temp 98.9 F (37.2 C) (Oral)   Resp 18   Ht 5\' 2"  (1.575 m)   Wt 76.1 kg   LMP 02/16/2020   SpO2 100%   BMI 30.69 kg/m    General: Pleasant female, no acute distress, alert and oriented CV: RRR, no murmurs Pulm: good respiratory effort, CTAB     Plan Proceed with laparoscopic bilateral cystectomy with excision of left dermoid cyst as planned.  We discussed possibility  of needing to remove the left ovary entirely if unable to salvage any healthy ovarian tissue and/or if hemostasis is a problem.  Post operative recovery expectations reviewed.  All questions answered and the patient agrees to proceed.    Joseph Pierini, MD 03/01/20

## 2020-03-01 NOTE — Anesthesia Procedure Notes (Signed)
Procedure Name: Intubation Date/Time: 03/01/2020 10:12 AM Performed by: Mechele Claude, CRNA Pre-anesthesia Checklist: Patient identified, Emergency Drugs available, Suction available and Patient being monitored Patient Re-evaluated:Patient Re-evaluated prior to induction Oxygen Delivery Method: Circle system utilized Preoxygenation: Pre-oxygenation with 100% oxygen Induction Type: IV induction Ventilation: Mask ventilation without difficulty Laryngoscope Size: Mac and 3 Grade View: Grade I Tube type: Oral Tube size: 7.0 mm Number of attempts: 1 Airway Equipment and Method: Stylet and Oral airway Placement Confirmation: ETT inserted through vocal cords under direct vision,  positive ETCO2 and breath sounds checked- equal and bilateral Secured at: 21 cm Tube secured with: Tape Dental Injury: Teeth and Oropharynx as per pre-operative assessment

## 2020-03-02 ENCOUNTER — Encounter (HOSPITAL_BASED_OUTPATIENT_CLINIC_OR_DEPARTMENT_OTHER): Payer: Self-pay | Admitting: Obstetrics and Gynecology

## 2020-03-02 LAB — CYTOLOGY - NON PAP

## 2020-03-02 LAB — SURGICAL PATHOLOGY

## 2020-03-02 NOTE — Anesthesia Postprocedure Evaluation (Signed)
Anesthesia Post Note  Patient: Tiffany Ali  Procedure(s) Performed: LAPAROSCOPIC OVARIAN CYSTECTOMY WITH PELVIC WASHINGS (Bilateral Abdomen) LAPAROSCOPIC LYSIS OF ADHESIONS (N/A Abdomen)     Patient location during evaluation: PACU Anesthesia Type: General Level of consciousness: awake Pain management: pain level controlled Vital Signs Assessment: post-procedure vital signs reviewed and stable Respiratory status: spontaneous breathing Cardiovascular status: stable Postop Assessment: no apparent nausea or vomiting Anesthetic complications: no   No complications documented.  Last Vitals:  Vitals:   03/01/20 1345 03/01/20 1430  BP: 113/70 110/74  Pulse: 88 72  Resp: 16 18  Temp:  36.7 C  SpO2: 92% 96%    Last Pain:  Vitals:   03/01/20 1430  TempSrc:   PainSc: 3    Pain Goal: Patients Stated Pain Goal: 4 (03/01/20 1430)                 Huston Foley

## 2020-03-04 ENCOUNTER — Telehealth: Payer: Self-pay | Admitting: *Deleted

## 2020-03-04 NOTE — Telephone Encounter (Signed)
Patient informed. 

## 2020-03-04 NOTE — Telephone Encounter (Signed)
Could have some bleeding from the vagina due to the tenaculum on the cervix (used to manipulate the uterus during the procedure)  Bleeding should resolve, encourage pelvic rest and following lifting restrictions <20 lbs and light activity only for 2 weeks at least.  If any heavy bleeding let us know.

## 2020-03-04 NOTE — Telephone Encounter (Signed)
Will route to [place] to relay.

## 2020-03-04 NOTE — Telephone Encounter (Signed)
Patient called and spoke with [place] (spanish speaking) post [employer] on 03/01/20. Patient called stating she is wearing a pad and having light bleeding, but when using the bathroom she is noticing more blood in the toilet, than she notices on the pad. She is not having any pain. Patient wanted to make sure this is normal? Please advise

## 2020-03-12 NOTE — Op Note (Signed)
Name: Tiffany Ali Age: 44 y.o. Date of Birth: 1975/07/26 Medical Record #: [id number]  Operative Note   Preoperative Diagnosis: Left lower quadrant pain, left dermoid cyst, right simple ovarian cyst Procedure: Operative laparoscopy;  left ovarian cystectomy, pelvic washings, lysis of adhesions Postoperative Diagnosis: Left lower quadrant pain, left dermoid cyst, omental adhesions Estimated Blood Loss: 50 mL Surgeon: Joseph Pierini, MD Assistant: Princess Bruins, MD Anesthesia: General ET, local 0.25% bupivacaine Specimen: Left ovarian cyst, pelvic washings for cytology Complications: None Findings: 8 cm left dermoid cyst containing hair and mucinous fluid contents.  Right ovary normal without cyst.  Normal bilateral fallopian tubes.  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  Dense omental adhesion to anterior abdominal wall.  Normal liver edge. Date: 03/01/20   Under general anesthesia, Tiffany Ali was placed in the dorsal lithotomy position with legs in Yellofin stirrups with sequential compression devices applied and functioning. The patient was prepped and draped in the usual sterile fashion.  The bladder was drained productive of clear urine.  A surgical team time out was performed to verify and agree on procedure and patient consent.    An open sided speculum was placed vaginally and a Hulka clamp was inserted into the cervical os and attached to the anterior lip of the cervix.  Attention was turned abdominally.  Change to new sterile gown and gloves was made.  The open Hasson technique was employed by making a injecting 0.25% bupivacaine in the infraumbilical skin and making a 10 mm incision with the 11 blade scalpel. The subcutaneous tissues were bluntly dissected to expose the fascia which was grasped with Kocher clamps and sharply entered with the Mayo scissors. Peritoneum was bluntly dissected away and intraperitoneal access was confirmed. A #0-Vicryl suture  was used to pursestring around the fascial opening. The Hasson trocar was inserted and intraperitoneal position was confirmed.  The balloon was inflated with an air filled syringe and pneumoperitoneum was obtained to 15 mmHg. The 10 mm laparoscope was inserted verifying an intraperitoneal position as well as verifying absence of injury to internal organs. No injury to the omentum or bowel was noted after entry into the abdomen. She was placed in slight Trendelenburg position.  Intraabdominal survey was conducted with findings as above.  Immediately apparent was the dense omental adhesion just in front of the [facility] trocar.  Inspection of the upper abdominal wall revealed clearings where a 5 mm left lateral and 5 mm right lateral trocar were placed under laparoscopic guidance without disrupting the adhesion, first injecting the skin with local and transilluminating the abdominal wall to avoid vessel injury.    The omental adhesion was inspected and noted to be free of bowel.  The LigaSure bipolar device was used to fulgurate and transect the adhesions of the omentum away from the abdominal wall to allow better visualization of the pelvis.  30 minutes were spent in lysis of the adhesions due to the density and need to frequently switch the instruments through the different trocars to maintain adequate visualization of and around the adhesion to safely clear it.  The omental adhesion was hemostatic as were the abdominal wall attachments.  With the adhesion cleared, the pelvis was irrigated and the contents were collected and sent to pathology for cytology analysis.  The course of the left ureter was identified. The left dermoid cyst was grasped with atraumatic forceps.   Monopolar L hook was used to incise the ovarian tissue overlying the cyst in attempt to locate the cyst underneath,  but the cyst and ovarian wall were stretched so thin by the size of the cyst that the immediate entry into the cyst was noted  with release of greenish serous contents from the cyst cavity consistent with dermoid cyst.  The opening was then widened with the monopolar cautery, and the cyst contents were suctioned with the suction irrigator.  Gentle traction and countertraction on the ovarian tissue and the underlying cyst wall resulted in removal of the cyst wall in its entirety.  There was a moderate amount adhesion of the cyst wall to the ovary tissue (presumably due to the inflammatory nature of this type of cyst) which extended the duration of time necessary to perform the cystectomy as the cyst wall did not all peel off easily.  Once the cyst was completely separated from the ovarian tissue, the specimen was set aside and the monopolar cautery was carefully applied to the hilum of the ovarian tissue, keeping the structure well away from the left pelvic sidewall and ureter in the process.  Hemostasis was noted and conservation of the left ovarian tissue was accomplished.  The laparoscope was switched to the 5 mm size and this was inserted into the lower quadrant trocar.  A 10 mm endoscopic bag was then inserted through the infraumbilical trocar and under direct visualization the cyst wall specimen was placed into the bag.  The trocar balloon was deflated and the trocar and the bag were removed from the abdominal cavity and passed off to be sent to pathology for analysis. The Hasson trocar was then placed back into the infraumbilical incision.  Pneumoperitoneum was reestablished.  Hemostasis was observed in the pelvis and at the omental adhesion.  The bilateral ureters showed normal vermiculation and were well away from the sites of dissection.  Careful and comprehensive evaluation of both large and small bowel was completed and no signs of thermal injury, trocar injury, or other damage were present.  The bladder was also intact without any apparent injury.  Copious irrigation and suction was performed to dilute and clear the cyst  contents from the pelvic cavity.  Upon completion of the laparoscopic procedure, pneumoperitoneum was evacuated, the trocars were removed under direct visualization. The skin incisions were each closed using #4-0 Monocryl with a running subcutaneous stitch and covered with Dermabond.  The uterine manipulator was removed and inspection of the cervix revealed hemostasis.  Instrument, sponge, and needle counts were correct times two.  The patient tolerated the procedure well and was brought to the recovery room in stable condition.  A qualified surgical assistant was required as an active participant in this case because skilled help was needed for retraction of the operative site, safe exposure of anatomy, assistance with providing laparoscopic countertraction during the case.  The surgical assistant was actively involved during the entire portion of this surgery.   Joseph Pierini, M.D., Cherlynn June

## 2020-03-15 ENCOUNTER — Encounter: Payer: Self-pay | Admitting: Obstetrics and Gynecology

## 2020-03-15 ENCOUNTER — Other Ambulatory Visit: Payer: Self-pay

## 2020-03-15 ENCOUNTER — Ambulatory Visit (INDEPENDENT_AMBULATORY_CARE_PROVIDER_SITE_OTHER): Payer: BC Managed Care – PPO | Admitting: Obstetrics and Gynecology

## 2020-03-15 VITALS — BP 146/86

## 2020-03-15 DIAGNOSIS — I10 Essential (primary) hypertension: Secondary | ICD-10-CM

## 2020-03-15 DIAGNOSIS — S301XXA Contusion of abdominal wall, initial encounter: Secondary | ICD-10-CM

## 2020-03-15 DIAGNOSIS — D271 Benign neoplasm of left ovary: Secondary | ICD-10-CM

## 2020-03-15 NOTE — Progress Notes (Signed)
   Tiffany Ali 12/21/1975 [id number]  SUBJECTIVE   REASON FOR APPOINTMENT Chief Complaint  Patient presents with  . Post op check     HISTORY OF PRESENT ILLNESS Tiffany Ali presents for routine 2 week post-operative follow up after a laparoscopic left ovarian cystectomy with pelvic washings and lysis of adhesions performed on 03/01/2020 for a left dermoid cyst.  The patient is doing well overall but has had some intermittent mild upper abdominal pain and bloating, gradual improvement since surgery.  She denies vaginal bleeding or discharge. She is tolerating normal diet.  Bowel and bladder function are normal she is taking a stool softener and having a daily bowel movement.  She has had some bruising in her umbilical incision and noted some dried blood.  Denies any fever or chills.  Spanish interpreter is present for the visit today.  OBJECTIVE  BP (!) 146/86   LMP 02/16/2020    PHYSICAL EXAM General:  Patient in no acute distress.  Abdomen: Soft, non tender, non-distended.  Incisions at bilateral lower quadrants are clean, dry, intact, well healing, no erythema nor drainage.  The infraumbilical incision has dime sized area of crusted dark blood.  The area surrounding the incision and umbilicus have resolving ecchymosis currently with the diameter of a sand dollar.  No palpable subcutaneous fluid collection.  No active bleeding.  No purulence.  Incision appears intact underneath the crusted blood  ASSESSMENT / PLAN  Routine 2 week post operative check.   The patient is overall doing well and meeting all postoperative milestones.  I encouraged her to continue to rest and follow lifting restrictions and activity limitations for 2 more weeks to allow continued healing at the umbilical incision.  It looks like she probably had a hematoma that surfaced in addition to some bruising that is resolving.  No concern for infection of the incision at this point.  I encouraged her to use some ice on  the area in addition to very light and gentle digital scrubbing in the shower with soap and water to clear the crusted blood.  I reviewed the benign pathology report describing the left dermoid cyst.  I showed her some pictures taken during the laparoscopy.  I would like her to return in 2 weeks so we can reevaluate the incision at that time and recheck her blood pressure (mildly elevated today).   Joseph Pierini MD 03/15/20

## 2020-03-29 ENCOUNTER — Other Ambulatory Visit: Payer: Self-pay

## 2020-03-29 ENCOUNTER — Ambulatory Visit (INDEPENDENT_AMBULATORY_CARE_PROVIDER_SITE_OTHER): Payer: BC Managed Care – PPO | Admitting: Obstetrics and Gynecology

## 2020-03-29 ENCOUNTER — Encounter: Payer: Self-pay | Admitting: Obstetrics and Gynecology

## 2020-03-29 VITALS — BP 118/76

## 2020-03-29 DIAGNOSIS — Z9889 Other specified postprocedural states: Secondary | ICD-10-CM

## 2020-03-29 NOTE — Progress Notes (Signed)
   Tiffany Ali 05-26-1975 [id number]   44 y.o. [id number] female presents for follow-up evaluation of her umbilical incision healing.  She was seen for the 2-week postoperative visit 03/15/2020 following her laparoscopic surgery on 03/01/2020 for removal of the left ovarian dermoid cyst with lysis of adhesions.  She had an area of crusting with surrounding ecchymosis at her umbilicus noted at her initial postop visit.  She says it has gotten much better in the last few weeks with resolution of the bruising and no drainage or pain. BP 118/76   LMP 03/22/2020   The 3 incisions on her abdomen are clean, dry, intact, well-healing.  The umbilical incision has a slight bit of normal scabbing remaining but no purulence or drainage with resolving ecchymosis inferiorly. Patient is reassured that her healing is going well and she may return to normal activity gradually.  Her blood pressure also looks a lot better today.  No charge for this brief postoperative visit.  Joseph Pierini MD 03/29/20

## 2020-12-28 ENCOUNTER — Ambulatory Visit (INDEPENDENT_AMBULATORY_CARE_PROVIDER_SITE_OTHER): Payer: BC Managed Care – PPO | Admitting: Obstetrics & Gynecology

## 2020-12-28 ENCOUNTER — Other Ambulatory Visit (HOSPITAL_COMMUNITY)
Admission: RE | Admit: 2020-12-28 | Discharge: 2020-12-28 | Disposition: A | Discharge status: Home / Self Care | Payer: BC Managed Care – PPO | Source: Ambulatory Visit | Attending: Obstetrics & Gynecology | Admitting: Obstetrics & Gynecology

## 2020-12-28 ENCOUNTER — Encounter: Payer: Self-pay | Admitting: Obstetrics & Gynecology

## 2020-12-28 ENCOUNTER — Other Ambulatory Visit: Payer: Self-pay

## 2020-12-28 VITALS — BP 110/70 | HR 104 | Resp 16 | Ht 59.5 in | Wt 167.0 lb

## 2020-12-28 DIAGNOSIS — Z6833 Body mass index (BMI) 33.0-33.9, adult: Secondary | ICD-10-CM

## 2020-12-28 DIAGNOSIS — Z01419 Encounter for gynecological examination (general) (routine) without abnormal findings: Secondary | ICD-10-CM | POA: Diagnosis not present

## 2020-12-28 DIAGNOSIS — Z9851 Tubal ligation status: Secondary | ICD-10-CM | POA: Diagnosis not present

## 2020-12-28 DIAGNOSIS — E6609 Other obesity due to excess calories: Secondary | ICD-10-CM

## 2020-12-28 NOTE — Progress Notes (Signed)
Tiffany Ali 1975-08-14 RE:[id number]   History:    45 y.o. G2P2L3 Married.  Twin pregnancy, then Single pregnancy.  S/P TL.  From [place].  RP:  Established patient presenting for annual gyn exam   HPI: Menses regular normal every month.  No BTB.  S/P TL.  Had a Left Ovarian Cystectomy by LPS in 2021 for a Dermoid Cyst.  No pelvic pain.  No pain with IC.  Breasts normal.  BMI 33.17.  Health labs with Fam MD.  Past medical history,surgical history, family history and social history were all reviewed and documented in the EPIC chart.  Gynecologic History Patient's last menstrual period was 12/12/2020 (exact date).  Obstetric History OB History  Gravida Para Term Preterm AB Living  3       0 3  SAB IAB Ectopic Multiple Live Births  0   0        # Outcome Date GA Lbr Len/2nd Weight Sex Delivery Anes PTL Lv  3 Gravida           2 Gravida           1 Gravida              ROS: A ROS was performed and pertinent positives and negatives are included in the history.  GENERAL: No fevers or chills. HEENT: No change in vision, no earache, sore throat or sinus congestion. NECK: No pain or stiffness. CARDIOVASCULAR: No chest pain or pressure. No palpitations. PULMONARY: No shortness of breath, cough or wheeze. GASTROINTESTINAL: No abdominal pain, nausea, vomiting or diarrhea, melena or bright red blood per rectum. GENITOURINARY: No urinary frequency, urgency, hesitancy or dysuria. MUSCULOSKELETAL: No joint or muscle pain, no back pain, no recent trauma. DERMATOLOGIC: No rash, no itching, no lesions. ENDOCRINE: No polyuria, polydipsia, no heat or cold intolerance. No recent change in weight. HEMATOLOGICAL: No anemia or easy bruising or bleeding. NEUROLOGIC: No headache, seizures, numbness, tingling or weakness. PSYCHIATRIC: No depression, no loss of interest in normal activity or change in sleep pattern.     Exam:   BP 110/70   Pulse (!) 104   Resp 16   Ht 4' 11.5" (1.511 m)   Wt 167 lb (75.8  kg)   LMP 12/12/2020 (Exact Date) Comment: btl  BMI 33.17 kg/m   Body mass index is 33.17 kg/m.  General appearance : Well developed well nourished female. No acute distress HEENT: Eyes: no retinal hemorrhage or exudates,  Neck supple, trachea midline, no carotid bruits, no thyroidmegaly Lungs: Clear to auscultation, no rhonchi or wheezes, or rib retractions  Heart: Regular rate and rhythm, no murmurs or gallops Breast:Examined in sitting and supine position were symmetrical in appearance, no palpable masses or tenderness,  no skin retraction, no nipple inversion, no nipple discharge, no skin discoloration, no axillary or supraclavicular lymphadenopathy Abdomen: no palpable masses or tenderness, no rebound or guarding Extremities: no edema or skin discoloration or tenderness  Pelvic: Vulva: Normal             Vagina: No gross lesions or discharge  Cervix: No gross lesions or discharge.  Pap reflex done.  Uterus  AV, normal size, shape and consistency, non-tender and mobile  Adnexa  Without masses or tenderness  Anus: Normal   Assessment/Plan:  45 y.o. female for annual exam   1. Encounter for routine gynecological examination with Papanicolaou smear of cervix Normal gynecologic exam.  Pap reflex done.  Breast exam normal.  Screening mammogram November 2021 was  negative.  Health labs with family physician. - Cytology - PAP( [place])  2. S/P tubal ligation  3. Class 1 obesity due to excess calories without serious comorbidity with body mass index (BMI) of 33.0 to 33.9 in adult  Recommend a lower calorie/carb diet.  Aerobic activities 5 times a week and light weightlifting every 2 days.  Princess Bruins MD, 3:28 PM 12/28/2020

## 2021-01-03 LAB — CYTOLOGY - PAP
Comment: NEGATIVE
Diagnosis: UNDETERMINED — AB
High risk HPV: NEGATIVE

## 2021-01-04 ENCOUNTER — Encounter: Payer: Self-pay | Admitting: Anesthesiology

## 2024-03-14 ENCOUNTER — Ambulatory Visit (INDEPENDENT_AMBULATORY_CARE_PROVIDER_SITE_OTHER): Admitting: Radiology

## 2024-03-14 ENCOUNTER — Encounter: Payer: Self-pay | Admitting: Radiology

## 2024-03-14 ENCOUNTER — Other Ambulatory Visit (HOSPITAL_COMMUNITY)
Admission: RE | Admit: 2024-03-14 | Discharge: 2024-03-14 | Disposition: A | Source: Ambulatory Visit | Attending: Radiology | Admitting: Radiology

## 2024-03-14 VITALS — BP 180/104 | HR 79 | Ht 59.75 in | Wt 165.0 lb

## 2024-03-14 DIAGNOSIS — Z1331 Encounter for screening for depression: Secondary | ICD-10-CM | POA: Diagnosis not present

## 2024-03-14 DIAGNOSIS — R030 Elevated blood-pressure reading, without diagnosis of hypertension: Secondary | ICD-10-CM | POA: Diagnosis not present

## 2024-03-14 DIAGNOSIS — Z01419 Encounter for gynecological examination (general) (routine) without abnormal findings: Secondary | ICD-10-CM | POA: Insufficient documentation

## 2024-03-14 DIAGNOSIS — Z1211 Encounter for screening for malignant neoplasm of colon: Secondary | ICD-10-CM

## 2024-03-14 NOTE — Progress Notes (Signed)
 Tiffany Ali [birth year] [id number]   History:  48 y.o. G2P3 presents for annual exam. (Daughter interpreting for her after signing consent form). No gyn concerns. BP is always elevated when she comes to the office she says, normal at home. Taking her meds daily. Has had a cardiac workup.  Gynecologic History Patient's last menstrual period was 02/18/2024 (exact date). Period Cycle (Days): 28 Period Duration (Days): 7-8 Period Pattern: Regular Menstrual Flow: Moderate, Heavy (heavy first 2 days) Menstrual Control: Maxi pad, Thin pad Dysmenorrhea: (!) Mild Dysmenorrhea Symptoms: Cramping Contraception/Family planning: tubal ligation Sexually active: yes Last Pap: 2022. Results were: abnormal Last mammogram: 12/18/23. Results were: normal  Obstetric History OB History  Gravida Para Term Preterm AB Living  3    0 3  SAB IAB Ectopic Multiple Live Births  0  0      # Outcome Date GA Lbr Len/2nd Weight Sex Type Anes PTL Lv  3 Gravida           2 Gravida           1 Gravida                03/14/2024   10:48 AM 07/19/2017   11:43 AM 12/01/2016    8:37 AM  Depression screen PHQ 2/9  Decreased Interest 0 0 0  Down, Depressed, Hopeless 0 0 0  PHQ - 2 Score 0 0 0  Altered sleeping  0   Tired, decreased energy  0   Change in appetite  0   Feeling bad or failure about yourself   0   Trouble concentrating  0   Moving slowly or fidgety/restless  0   Suicidal thoughts  0   PHQ-9 Score  0      The following portions of the patient's history were reviewed and updated as appropriate: allergies, current medications, past family history, past medical history, past social history, past surgical history, and problem list.  Review of Systems  All other systems reviewed and are negative.   Past medical history, past surgical history, family history and social history were all reviewed and documented in the EPIC chart.  Exam:  Vitals:   03/14/24 1044 03/14/24 1052  BP: (!) 178/102  (!) 180/104  Pulse: 79   SpO2: 99%   Weight: 165 lb (74.8 kg)   Height: 4' 11.75 (1.518 m)    Body mass index is 32.49 kg/m.  Physical Exam Vitals and nursing note reviewed. Exam conducted with a chaperone present.  Constitutional:      Appearance: Normal appearance. She is normal weight.  HENT:     Head: Normocephalic and atraumatic.  Neck:     Thyroid: No thyroid mass, thyromegaly or thyroid tenderness.  Cardiovascular:     Rate and Rhythm: Regular rhythm.     Heart sounds: Normal heart sounds.  Pulmonary:     Effort: Pulmonary effort is normal.     Breath sounds: Normal breath sounds.  Chest:  Breasts:    Breasts are symmetrical.     Right: Normal. No inverted nipple, mass, nipple discharge, skin change or tenderness.     Left: Normal. No inverted nipple, mass, nipple discharge, skin change or tenderness.  Abdominal:     General: Abdomen is flat. Bowel sounds are normal.     Palpations: Abdomen is soft.  Genitourinary:    General: Normal vulva.     Vagina: Normal. No vaginal discharge, bleeding or lesions.     Cervix: Normal. No discharge  or lesion.     Uterus: Normal. Not enlarged and not tender.      Adnexa: Right adnexa normal and left adnexa normal.       Right: No mass, tenderness or fullness.         Left: No mass, tenderness or fullness.    Lymphadenopathy:     Upper Body:     Right upper body: No axillary adenopathy.     Left upper body: No axillary adenopathy.  Skin:    General: Skin is warm and dry.  Neurological:     Mental Status: She is alert and oriented to person, place, and time.  Psychiatric:        Mood and Affect: Mood normal.        Thought Content: Thought content normal.        Judgment: Judgment normal.      Tiffany Ali, CMA present for exam  Assessment/Plan:   1. Well woman exam with routine gynecological exam (Primary) - Cytology - PAP( [place])  2. Depression screening  3. Elevated blood pressure reading Continue  meds Check at home Follow up with PCP if it remains elevated Warning signs reviewed  4. Colon cancer screening - Cologuard      Discussed SBE, colonoscopy and DEXA screening as directed/appropriate. Recommend of exercise weekly, including weight bearing exercise. Encouraged the use of seatbelts and sunscreen.  Return in about 1 year (around 03/14/2025) for Annual.  GINETTE COZIER B WHNP-BC 11:09 AM 03/14/2024

## 2024-03-14 NOTE — Patient Instructions (Signed)
 Preventive Care 58-46 Years Old, Female  Preventive care refers to lifestyle choices and visits with your health care provider that can promote health and wellness. Preventive care visits are also called wellness exams.  What can I expect for my preventive care visit?  Counseling  Your health care provider may ask you questions about your:  Medical history, including:  Past medical problems.  Family medical history.  Pregnancy history.  Current health, including:  Menstrual cycle.  Method of birth control.  Emotional well-being.  Home life and relationship well-being.  Sexual activity and sexual health.  Lifestyle, including:  Alcohol, nicotine or tobacco, and drug use.  Access to firearms.  Diet, exercise, and sleep habits.  Work and work Astronomer.  Sunscreen use.  Safety issues such as seatbelt and bike helmet use.  Physical exam  Your health care provider will check your:  Height and weight. These may be used to calculate your BMI (body mass index). BMI is a measurement that tells if you are at a healthy weight.  Waist circumference. This measures the distance around your waistline. This measurement also tells if you are at a healthy weight and may help predict your risk of certain diseases, such as type 2 diabetes and high blood pressure.  Heart rate and blood pressure.  Body temperature.  Skin for abnormal spots.  What immunizations do I need?    Vaccines are usually given at various ages, according to a schedule. Your health care provider will recommend vaccines for you based on your age, medical history, and lifestyle or other factors, such as travel or where you work.  What tests do I need?  Screening  Your health care provider may recommend screening tests for certain conditions. This may include:  Lipid and cholesterol levels.  Diabetes screening. This is done by checking your blood sugar (glucose) after you have not eaten for a while (fasting).  Pelvic exam and Pap test.  Hepatitis B test.  Hepatitis C  test.  HIV (human immunodeficiency virus) test.  STI (sexually transmitted infection) testing, if you are at risk.  Lung cancer screening.  Colorectal cancer screening.  Mammogram. Talk with your health care provider about when you should start having regular mammograms. This may depend on whether you have a family history of breast cancer.  BRCA-related cancer screening. This may be done if you have a family history of breast, ovarian, tubal, or peritoneal cancers.  Bone density scan. This is done to screen for osteoporosis.  Talk with your health care provider about your test results, treatment options, and if necessary, the need for more tests.  Follow these instructions at home:  Eating and drinking    Eat a diet that includes fresh fruits and vegetables, whole grains, lean protein, and low-fat dairy products.  Take vitamin and mineral supplements as recommended by your health care provider.  Do not drink alcohol if:  Your health care provider tells you not to drink.  You are pregnant, may be pregnant, or are planning to become pregnant.  If you drink alcohol:  Limit how much you have to 0-1 drink a day.  Know how much alcohol is in your drink. In the [place] one drink equals one 12 oz bottle of beer (355 mL), one 5 oz glass of wine (148 mL), or one 1 oz glass of hard liquor (44 mL).  Lifestyle  Brush your teeth every morning and night with fluoride toothpaste. Floss one time each day.  Exercise for at least  30 minutes 5 or more days each week.  Do not use any products that contain nicotine or tobacco. These products include cigarettes, chewing tobacco, and vaping devices, such as e-cigarettes. If you need help quitting, ask your health care provider.  Do not use drugs.  If you are sexually active, practice safe sex. Use a condom or other form of protection to prevent STIs.  If you do not wish to become pregnant, use a form of birth control. If you plan to become pregnant, see your health care provider for a  prepregnancy visit.  Take aspirin only as told by your health care provider. Make sure that you understand how much to take and what form to take. Work with your health care provider to find out whether it is safe and beneficial for you to take aspirin daily.  Find healthy ways to manage stress, such as:  Meditation, yoga, or listening to music.  Journaling.  Talking to a trusted person.  Spending time with friends and family.  Minimize exposure to UV radiation to reduce your risk of skin cancer.  Safety  Always wear your seat belt while driving or riding in a vehicle.  Do not drive:  If you have been drinking alcohol. Do not ride with someone who has been drinking.  When you are tired or distracted.  While texting.  If you have been using any mind-altering substances or drugs.  Wear a helmet and other protective equipment during sports activities.  If you have firearms in your house, make sure you follow all gun safety procedures.  Seek help if you have been physically or sexually abused.  What's next?  Visit your health care provider once a year for an annual wellness visit.  Ask your health care provider how often you should have your eyes and teeth checked.  Stay up to date on all vaccines.  This information is not intended to replace advice given to you by your health care provider. Make sure you discuss any questions you have with your health care provider.  Document Revised: 11/03/2020 Document Reviewed: 11/03/2020  Elsevier Patient Education  2024 [employer].

## 2024-03-19 ENCOUNTER — Ambulatory Visit: Payer: Self-pay | Admitting: Radiology

## 2024-03-19 LAB — CYTOLOGY - PAP
Comment: NEGATIVE
Diagnosis: NEGATIVE
High risk HPV: NEGATIVE

## 2024-03-31 LAB — COLOGUARD: COLOGUARD: NEGATIVE
